# Patient Record
Sex: Female | Born: 1995 | Race: White | Hispanic: No | Marital: Single | Smoking: Never smoker
Health system: Southern US, Community
[De-identification: ages and names within clinical notes are randomized; demographics above are authoritative.]

## PROBLEM LIST (undated history)

## (undated) DIAGNOSIS — F32A Depression, unspecified: Secondary | ICD-10-CM

---
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
## 2020-02-06 ENCOUNTER — Encounter (HOSPITAL_COMMUNITY): Payer: Self-pay | Admitting: Psychiatry

## 2020-02-07 ENCOUNTER — Encounter (HOSPITAL_COMMUNITY): Payer: Self-pay | Admitting: Psychiatry

## 2020-02-07 ENCOUNTER — Telehealth (INDEPENDENT_AMBULATORY_CARE_PROVIDER_SITE_OTHER): Payer: BC Managed Care – PPO | Admitting: Psychiatry

## 2020-02-07 ENCOUNTER — Other Ambulatory Visit: Payer: Self-pay

## 2020-02-07 DIAGNOSIS — F3281 Premenstrual dysphoric disorder: Secondary | ICD-10-CM | POA: Diagnosis not present

## 2020-02-07 MED ORDER — HYDROXYZINE PAMOATE 25 MG PO CAPS: 25.0000 mg | ORAL_CAPSULE | Freq: Three times a day (TID) | ORAL | 1 refills | Status: AC | PRN

## 2020-02-07 MED ORDER — SERTRALINE HCL 100 MG PO TABS: 100.0000 mg | ORAL_TABLET | Freq: Every day | ORAL | 1 refills | Status: DC

## 2020-02-07 MED ORDER — LAMOTRIGINE 25 MG PO TABS: 25.0000 mg | ORAL_TABLET | Freq: Every evening | ORAL | 1 refills | Status: DC

## 2020-02-07 NOTE — Progress Notes (Signed)
Psychiatric Initial Adult Assessment   Patient Identification: Stacy Randall MRN:  [id number] Date of Evaluation:  02/07/2020 Referral Source: Self Chief Complaint:   Chief Complaint    Establish Care     Interview was conducted using videoconferencing application and I verified that I was speaking with the correct person using two identifiers. I discussed the limitations of evaluation and management by telemedicine and  the availability of in person appointments. Patient expressed understanding and agreed to proceed. Patient location - home; physician - home office.  Visit Diagnosis:    ICD-10-CM   1. Premenstrual dysphoric disorder  F32.81     History of Present Illness:  Stacy Randall is a 24 yo SWF who has been treated by her PCP since October 2020 for  episodic depression/anxiety. She has been feeling down, hopeless, at times passively suicidal for a day then next day will feel normal. She initially was experiencing significant anxiety but this is no longer the case.  She reports that her mood fluctuations occur for several days prior to her menses and resolve once period begins. Review of the chart indicates that she has been having these problems for a long time but they worsened in October to the point of her starting to have intermittent passive SI. She has no intent or plan and never attempted suicide. She reports good sleep and normal appetite. She denies having manic/hypomani episodes, psychotic symptoms. She has no hx of alcohol or drug abuse. There is no family hx of any psychiatric, alcohol or drug abuse issues. Medical hx was reviewed and is noncontributory (no chronic medical problems, no hx of surgeries).  In October she was started on escitalopram 10 mg which was subsequently increased to 20 mg. She tolerated it well (perhaps some weight gain) but her "moods" did not improve. 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 She only used hydroxyzine 2-3 x; it was effective but she no longer needs it, She also briefly took doxylamine for some insomnia (again no longer am problem). Few months ago lamotrigine was added (25-50 mg) for "moodiness". Unclear if it was helpful at 50 mg but she is now on only 25 mg at HS.   Associated Signs/Symptoms: Depression Symptoms:  depressed mood, difficulty concentrating, (Hypo) Manic Symptoms:  Denies Anxiety Symptoms:  denies Psychotic Symptoms:  denies PTSD Symptoms: NA  Past Psychiatric History: see above  Previous Psychotropic Medications: Yes   Substance Abuse History in the last 12 months:  No.  Consequences of Substance Abuse: NA  Past Medical History: History reviewed. No pertinent past medical history. History reviewed. No pertinent surgical history.  Family Psychiatric History: None  Family History: History reviewed. No pertinent family history.  Social History:   Social History   Socioeconomic History  . Marital status: Single    Spouse name: Not on file  . Number of children: 0  . Years of education: Not on file  . Highest education level: Not on file  Occupational History  . Occupation: CNA  Tobacco Use  . Smoking status: Never Smoker  . Smokeless tobacco: Never Used  Vaping Use  . Vaping Use: Never used  Substance and Sexual Activity  . Alcohol use: No  . Drug use: Never  . Sexual activity: Never    Birth control/protection: Abstinence  Other Topics Concern  . Not on file  Social History Narrative  . Not on file   Social Determinants of  Health   Financial Resource Strain:   . Difficulty of Paying Living Expenses:   Food Insecurity:   . Worried About Programme researcher, broadcasting/film/video in the Last Year:   . Barista in the Last Year:   Transportation Needs:   . Freight forwarder (Medical):   Marland Kitchen Lack of Transportation (Non-Medical):   Physical Activity:   . Days of Exercise per Week:   . Minutes of Exercise per  Session:   Stress:   . Feeling of Stress :   Social Connections:   . Frequency of Communication with Friends and Family:   . Frequency of Social Gatherings with Friends and Family:   . Attends Religious Services:   . Active Member of Clubs or Organizations:   . 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 She likes her job but it is physically demending (she is slim and finds it hard to lift patients when needed) and she is seeking some accommodations there.  Allergies:  No Known Allergies  Metabolic Disorder Labs: No results found for: HGBA1C, MPG No results found for: PROLACTIN No results found for: CHOL, TRIG, HDL, CHOLHDL, VLDL, LDLCALC No results found for: TSH  Therapeutic Level Labs: No results found for: LITHIUM No results found for: CBMZ No results found for: VALPROATE  Current Medications: Current Outpatient Medications  Medication Sig Dispense Refill  . lamoTRIgine (LAMICTAL) 25 MG tablet Take 1 tablet (25 mg total) by mouth at bedtime. 30 tablet 1  . fluconazole (DIFLUCAN) 150 MG tablet Take 1 tablet (150 mg total) by mouth once. May repeat in 3 days if symptoms do not resolve 2 tablet 0  . hydrOXYzine (VISTARIL) 25 MG capsule Take 1 capsule (25 mg total) by mouth 3 (three) times daily as needed. 30 capsule 1  . sertraline (ZOLOFT) 100 MG tablet Take 1 tablet (100 mg total) by mouth daily. 30 tablet 1   No current facility-administered medications for this visit.    Psychiatric Specialty Exam: Review of Systems  Psychiatric/Behavioral:       None besides those listed in HPI.     There were no vitals taken for this visit.There is no height or weight on file to calculate BMI.  General Appearance: Casual and Well Groomed  Eye Contact:  Fair  Speech:  Clear and Coherent and Normal Rate  Volume:  Decreased  Mood:  Depressed  Affect:  Restricted  Thought Process:  Goal  Directed  Orientation:  Full (Time, Place, and Person)  Thought Content:  Logical  Suicidal Thoughts:  No  Homicidal Thoughts:  No  Memory:  Immediate;   Good Recent;   Good Remote;   Good  Judgement:  Good  Insight:  Fair  Psychomotor Activity:  Normal  Concentration:  Concentration: Fair  Recall:  Good  Fund of Knowledge:Good  Language: Good  Akathisia:  Negative  Handed:  Right  AIMS (if indicated):  not done  Assets:  Desire for Improvement Financial Resources/Insurance Housing Physical Health Social Support Talents/Skills  ADL's:  Intact  Cognition: WNL  Sleep:  Good   Assessment and Plan: 24 yo SWF who has been treated by her PCP since October 2020 for  episodic depression/anxiety. She has been feeling down, hopeless, at times passively suicidal for a day then next day will feel normal. She initially was experiencing significant anxiety but this is no longer  the case.  She reports that her mood fluctuations occur for several days prior to her menses and resolve once period begins. Review of the chart indicates that she has been having these problems for a long time but they worsened in October to the point of her starting to have intermittent passive SI. She has no intent or plan and never attempted suicide. She reports good sleep and normal appetite. She denies having manic/hypomani episodes, psychotic symptoms. She has no hx of alcohol or drug abuse. There is no family hx of any psychiatric, alcohol or drug abuse issues. Medical hx was reviewed and is noncontributory (no chronic medical problems, no hx of surgeries).  In October she was started on escitalopram 10 mg which was subsequently increased to 20 mg. She tolerated it well (perhaps some weight gain) but her "moods" did not improve. 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 She only used hydroxyzine 2-3 x; it was effective but she no longer  needs it, She also briefly took doxylamine for some insomnia (again no longer am problem). Few months ago lamotrigine was added (25-50 mg) for "moodiness". Unclear if it was helpful at 50 mg but she is now on only 25 mg at HS.   I believe Stacy Randall is struggling with premenstrual dysphoric disorder and an SSRI is a right medication to use but she likely needs dose to be increased. I see no sufficient evidence to suspect major depressive disorder or bipolar disorder.  Dx: PMD  Plan: Increase sertraline to 100 mg at HS; continue lamictal 25 mg at HS for now. Mother Stacy Randall) thought that when her daughter was on 50 mg her mood possible improved. We may want to increase the dose if sertraline is only partially effective (otherwise I will dc it next). Next appointment in 6 weeks. The plan was discussed with patient and mother who had an opportunity to ask questions and these were all answered. I spend 45 minutes in videoconferencing with the patient and devoted approximately 50% of this time to explanation of diagnosis, discussion of treatment options and med education.   Magdalene Patricia, MD 6/30/20218:34 AM

## 2020-03-18 ENCOUNTER — Telehealth (HOSPITAL_COMMUNITY): Payer: BC Managed Care – PPO | Admitting: Psychiatry

## 2020-03-18 ENCOUNTER — Other Ambulatory Visit: Payer: Self-pay

## 2020-03-24 ENCOUNTER — Other Ambulatory Visit (HOSPITAL_COMMUNITY): Payer: Self-pay | Admitting: Psychiatry

## 2020-06-11 ENCOUNTER — Telehealth (HOSPITAL_COMMUNITY): Payer: Self-pay | Admitting: Professional

## 2020-06-12 ENCOUNTER — Other Ambulatory Visit (HOSPITAL_COMMUNITY): Payer: BC Managed Care – PPO | Attending: Psychiatry | Admitting: Licensed Clinical Social Worker

## 2020-06-12 ENCOUNTER — Other Ambulatory Visit: Payer: Self-pay

## 2020-06-12 DIAGNOSIS — F332 Major depressive disorder, recurrent severe without psychotic features: Secondary | ICD-10-CM

## 2020-06-12 DIAGNOSIS — F411 Generalized anxiety disorder: Secondary | ICD-10-CM

## 2020-06-12 NOTE — Psych (Signed)
Virtual Visit via Video Note  I connected with Stacy Randall on 06/12/20 at 11:30 AM EDT by a video enabled telemedicine application and verified that I am speaking with the correct person using two identifiers.  Location: Patient: Patient Home Provider: Clinical home office   I discussed the limitations of evaluation and management by telemedicine and the availability of in person appointments. The patient expressed understanding and agreed to proceed.  I discussed the assessment and treatment plan with the patient. The patient was provided an opportunity to ask questions and all were answered. The patient agreed with the plan and demonstrated an understanding of the instructions.   The patient was advised to call back or seek an in-person evaluation if the symptoms worsen or if the condition fails to improve as anticipated.  I provided 60 minutes of non-face-to-face time during this encounter.   Donia Guiles, LCSW    Comprehensive Clinical Assessment (CCA) Note  06/12/2020 Stacy Randall [id number]   CCA Biopsychosocial  Intake/Chief Complaint:  Pt presents as referral to PHP via family member. Pt reports episodic history of depression and anxiety since at least 24 years of age. Pt denies previous therapeutic treatment and that she has tried a handful of medications since the beginning of this year. Pt states she does not like the idea of medication and becomes noncompliant. Pt has not taken medication in the past 2 months. In June, pt was diagnosed with PMDD and pt identifies struggling with mood around her menstrual cycle, however reports today that her symptoms are independent to her cycle and not confined to any patterns related to menstruation. Pt reports her current epiosde of depression has been increasing since July and that currently she is concerned about the interference it is causing at work. Pt states having to call out and leave early due to symptoms. Pt states she  has been isolating and shutting down and her support system has expressed concern. Pt denies substance use history and AVH. Pt reports passive SI and hopelessness.   Patient Reported Schizophrenia/Schizoaffective Diagnosis in Past: No   Mental Health Symptoms Depression:  Change in energy/activity;Difficulty Concentrating;Hopelessness;Increase/decrease in appetite;Irritability;Tearfulness   Duration of Depressive symptoms: Greater than two weeks   Mania:  None   Anxiety:   Irritability;Restlessness;Tension;Worrying;Difficulty concentrating   Psychosis:  None   Duration of Psychotic symptoms: No data recorded  Trauma:  None   Obsessions:  None   Compulsions:  None   Inattention:  None   Hyperactivity/Impulsivity:  N/A   Oppositional/Defiant Behaviors:  None   Emotional Irregularity:  None   Other Mood/Personality Symptoms:  No data recorded   Mental Status Exam Appearance and self-care  Stature:  Tall   Weight:  Thin   Clothing:  Casual   Grooming:  Normal   Cosmetic use:  None   Posture/gait:  Normal   Motor activity:  Not Remarkable   Sensorium  Attention:  Normal   Concentration:  Anxiety interferes   Orientation:  X5   Recall/memory:  Normal   Affect and Mood  Affect:  Depressed;Flat   Mood:  Anxious;Depressed   Relating  Eye contact:  Fleeting   Facial expression:  Depressed   Attitude toward examiner:  Cooperative   Thought and Language  Speech flow: Normal   Thought content:  Appropriate to Mood and Circumstances   Preoccupation:  None   Hallucinations:  None   Organization:  No data recorded  [employer] of Knowledge:  Fair   Intelligence:  Average   Abstraction:  Normal   Judgement:  Fair   Reality Testing:  Realistic   Insight:  Poor   Decision Making:  Vacilates   Social Functioning  Social Maturity:  No data recorded  Social Judgement:  No data recorded  Stress  Stressors:  Work;Transitions    Coping Ability:  Human resources officer Deficits:  Self-care   Supports:  Family      Religion: Religion/Spirituality Are You A Religious Person?: No  Leisure/Recreation: Leisure / Recreation Do You Have Hobbies?: Yes Leisure and Hobbies: playing video games, being outdoors, going on drives  Exercise/Diet: Exercise/Diet Do You Exercise?: Yes What Type of Exercise Do You Do?: Hiking, Other (Comment) How Many Times a Week Do You Exercise?: 4-5 times a week Have You Gained or Lost A Significant Amount of Weight in the Past Six Months?: No Do You Follow a Special Diet?: No Do You Have Any Trouble Sleeping?: Yes Explanation of Sleeping Difficulties: Pt reports anxiety interferes with falling asleep   CCA Employment/Education  Employment/Work Situation: Employment / Work Situation Employment situation: Employed 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 patient been employed?: about 2 mo Patient's job has been impacted by current illness: Yes Describe how patient's job has been impacted: Pt has had to leave early and call out Has patient ever been in the [employer]?: No  Education: Education Is Patient Currently Attending School?: No Did Garment/textile technologist From [employer]?: Yes Did You Have An Individualized Education Program (IIEP): No Did You Have Any Difficulty At [employer]?: No Patient's Education Has Been Impacted by Current Illness: No   CCA Family/Childhood History  Family and Relationship History: Family history Marital status: Single Does patient have children?: No  Childhood History:  Childhood History By whom was/is the patient raised?: Both parents Patient's description of current relationship with people who raised him/her: Pt reports parents are supportive and they have a positive relationship Does patient have siblings?: Yes Number of Siblings: 3 Description of patient's current relationship with siblings: Pt  reports positive relationship with her siblings Did patient suffer any verbal/emotional/physical/sexual abuse as a child?: No Did patient suffer from severe childhood neglect?: No Has patient ever been sexually abused/assaulted/raped as an adolescent or adult?: No Was the patient ever a victim of a crime or a disaster?: No Witnessed domestic violence?: No Has patient been affected by domestic violence as an adult?: No  Child/Adolescent Assessment:     CCA Substance Use  Alcohol/Drug Use: Alcohol / Drug Use Pain Medications: Pt denies Prescriptions: Pt denies Over the Counter: Pt denies History of alcohol / drug use?: No history of alcohol / drug abuse                         ASAM's:  Six Dimensions of Multidimensional Assessment  Dimension 1:  Acute Intoxication and/or Withdrawal Potential:      Dimension 2:  Biomedical Conditions and Complications:      Dimension 3:  Emotional, Behavioral, or Cognitive Conditions and Complications:     Dimension 4:  Readiness to Change:     Dimension 5:  Relapse, Continued use, or Continued Problem Potential:     Dimension 6:  Recovery/Living Environment:     ASAM Severity Score:    ASAM Recommended Level of Treatment:     Substance use Disorder (SUD)    Recommendations for Services/Supports/Treatments: Recommendations for Services/Supports/Treatments Recommendations For Services/Supports/Treatments: Partial Hospitalization (Pt is  recommended PHP due to increasing symptoms that are causing decline in daily functioning)  DSM5 Diagnoses: Patient Active Problem List   Diagnosis Date Noted  . Premenstrual dysphoric disorder 02/07/2020    Patient Centered Plan: Patient is on the following Treatment Plan(s):  Anxiety and Depression   Referrals to Alternative Service(s): Referred to Alternative Service(s):   Place:   Date:   Time:    Referred to Alternative Service(s):   Place:   Date:   Time:    Referred to Alternative  Service(s):   Place:   Date:   Time:    Referred to Alternative Service(s):   Place:   Date:   Time:     Donia Guiles, LCSW

## 2020-06-13 ENCOUNTER — Other Ambulatory Visit: Payer: Self-pay

## 2020-06-13 ENCOUNTER — Encounter (HOSPITAL_COMMUNITY): Payer: Self-pay

## 2020-06-13 ENCOUNTER — Other Ambulatory Visit (HOSPITAL_COMMUNITY): Payer: BC Managed Care – PPO | Admitting: Occupational Therapy

## 2020-06-13 ENCOUNTER — Other Ambulatory Visit (HOSPITAL_COMMUNITY): Payer: BC Managed Care – PPO | Admitting: Licensed Clinical Social Worker

## 2020-06-13 DIAGNOSIS — F332 Major depressive disorder, recurrent severe without psychotic features: Secondary | ICD-10-CM

## 2020-06-13 DIAGNOSIS — R41844 Frontal lobe and executive function deficit: Secondary | ICD-10-CM

## 2020-06-13 DIAGNOSIS — R4589 Other symptoms and signs involving emotional state: Secondary | ICD-10-CM

## 2020-06-13 DIAGNOSIS — F411 Generalized anxiety disorder: Secondary | ICD-10-CM

## 2020-06-13 NOTE — Therapy (Signed)
[facility] PARTIAL HOSPITALIZATION PROGRAM [address] [place], [place], [address] Phone: [phone]   Fax:  [phone] Virtual Visit via Video Note  I connected with Stacy Randall on 06/13/20 at  11:00 AM EDT by a video enabled telemedicine application and verified that I am speaking with the correct person using two identifiers.  Location: Patient: Patient Home Provider: Clinic Office    I discussed the limitations of evaluation and management by telemedicine and the availability of in person appointments. The patient expressed understanding and agreed to proceed.     I discussed the assessment and treatment plan with the patient. The patient was provided an opportunity to ask questions and all were answered. The patient agreed with the plan and demonstrated an understanding of the instructions.   The patient was advised to call back or seek an in-person evaluation if the symptoms worsen or if the condition fails to improve as anticipated.  I provided 75 minutes of non-face-to-face time during this encounter. 15 minutes OT Evaluation 60 minutes OT Group Therapy   Donne Hazel, OT   Occupational Therapy Evaluation  Patient Details  Name: Stacy Randall MRN: [id number] Date of Birth: Jul 12, 1996 Referring Provider (OT): Hillery Jacks   Encounter Date: 06/13/2020   OT End of Session - 06/13/20 1330    Visit Number 1    Number of Visits 20    Date for OT Re-Evaluation 07/11/20    Authorization Type [employer]    OT Start Time 1100   1030-1045   OT Stop Time 1200    OT Time Calculation (min) 60 min    Activity Tolerance Patient tolerated treatment well    Behavior During Therapy Stacy [facility] for tasks assessed/performed           History reviewed. No pertinent past medical history.  History reviewed. No pertinent surgical history.  There were no vitals filed for this visit.   Subjective Assessment - 06/13/20 1328    Currently in Pain?  No/denies             [facility] OT Assessment - 06/13/20 0001      Assessment   Medical Diagnosis Major depression    Referring Provider (OT) Hillery Jacks      Precautions   Precautions None      Balance Screen   Has the patient fallen in the past 6 months No    Has the patient had a decrease in activity level because of a fear of falling?  No    Is the patient reluctant to leave their home because of a fear of falling?  No             OT Education - 06/13/20 1328    Education Details Educated on OT within [facility] programming along with different communication styles and identified strategies/tips to practice being more assertive    Person(s) Educated Patient    Methods Explanation;Handout    Comprehension Verbalized understanding            OT Short Term Goals - 06/13/20 1335      OT SHORT TERM GOAL #1   Title Pt will actively engage in OT group sessions throughout duration of PHP programming, in order to promote daily structure, social engagement, and opportunities to develop and utilize adaptive strategies to maximize functional performance in preparation for safe transition and integration back into school, work, and the community.    Time 4    Period Weeks    Status  New    Target Date 07/11/20      OT SHORT TERM GOAL #2   Title Pt will practice and identify 1-3 adaptive coping strategies she can utilize, in order to safely manage increased depression/anxiety, with min cues, in preparation for safe and healthy reintegration back into the community at discharge.    Time 4    Period Weeks    Status New    Target Date 07/11/20      OT SHORT TERM GOAL #3   Title Pt will identify 1-3 stress management strategies she can utilize, in order to safely manage increased psychosocial stressors identified, with min cues, in preparation for safe transition back to the community at discharge.    Time 4    Period Weeks    Status New    Target Date 07/11/20         Occupational  Therapy Assessment 06/13/2020  [name] is a 24 y/o female with PMHx of MDD and PMDD who was referred to [facility] by her outpatient provider for worsening depression and anxiety outside of her menstrual cycle that began affecting her performance at work. Upon approach, pt presents as calm, cooperative, and relatively forth coming with information. Pt reports enjoying video games, movies, and driving around listening to music and identifies goal for admission "manage these thoughts and feelings so it stops affecting my work".   Precautions/Limitations: None noted  Cognition: Appears intact   Visual Motor: Denies use of any visual aids including use of glasses/contacts.   Living Situation: Pt lives with Mom and her two dogs Victory Dakin and Roxie.  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   ADL/iADL Performance: Pt denies any difficulties with ADL/iADL performance outside of work.    Leisure Interests and Hobbies: Enjoys video games, movies, and driving around listening to music  Social Support: Identifies Mom and older sister as sources of support    What do you do when you are very stressed, angry, upset, sad or anxious? Isolate from others, Cry, Talk to someone, Sleep and Listen to music   What helps when you are not feeling well? Wrapping in a blanket, Taking a shower or bath, Going for a walk, Having a warm or cool drink, Pacing the hall, Eating something and Listening to music  What are some things that make it MORE difficult for you when you are already upset? Being criticized and Yelling  Is there anything specific that you would like help with while you're in the hospital? Stress Management  Assessment: Pt demonstrates behavior that inhibits/restricts participation in occupation and would benefit from skilled occupational therapy services to address current difficulties with symptom management, emotion regulation, socialization, stress management, time  management, job readiness, financial wellness, health and nutrition, sleep hygiene, ADL/iADL performance and leisure participation, in preparation for reintegration and return to community at discharge.   Plan: Pt will participate in skilled occupational therapy sessions (group and/or individual) in order to promote daily structure, social engagement, and opportunities to develop and utilize adaptive strategies to maximize functional performance in preparation for safe transition and integration back into school, work, and/or the community at discharge. OT sessions will occur 4-5 x per week for 2-4 weeks.   Donne Hazel, MOT, OTR/L  Group Session:  S: "I think I communicate differently dependent on the person I am speaking with."  O:Today's group focused on topic of Communication Styles. Group members were educated on the different styles including passive, aggressive, and assertive  communication. Members shared and reflected on which style they most often find themselves communicating in and how to transition to a more assertive approach.   A: Chanay was active in her participation of discussion and activity, sharing that her communication style changes dependent on who she is speaking with. She shared that she often takes a passive approach when speaking to her family members and at work. She appeared receptive to information received and related often to other group members.   P: Continue to attend PHP OT group sessions 5x week for 4 weeks to promote daily structure, social engagement, and opportunities to develop and utilize adaptive strategies to maximize functional performance in preparation for safe transition and integration back into school, work, and the community. Plan to address topic of assertive communication in next OT group session.     Plan - 06/13/20 1330    Clinical Impression Statement Stacy Randall is a 24 y/o female with PMHx of premenstrual dysphoric disorder with limited  reports of increased psychosocial stressors, however reports increased anxiety, depression, and negative thoughts, negatively impacting her ability to function at work. Pt reports a desire to to engage in [facility] programming in order to manage identified stressors and to engage meaningfully in identified areas of occupation and ADL/iADLs.    OT Occupational Profile and History Problem Focused Assessment - Including review of records relating to presenting problem    Occupational performance deficits (Please refer to evaluation for details): ADL's;IADL's;Rest and Sleep;Work;Education;Leisure;Social Participation    Body Structure / Function / Physical Skills ADL    Cognitive Skills Attention;Consciousness;Emotional;Energy/Drive;Memory;Perception;Problem Solve;Safety Awareness;Temperament/Personality;Thought;Understand    Psychosocial Skills Coping Strategies;Environmental  Adaptations;Habits;Interpersonal Interaction;Routines and Behaviors    Rehab Potential Good    Clinical Decision Making Limited treatment options, no task modification necessary    Comorbidities Affecting Occupational Performance: May have comorbidities impacting occupational performance    Modification or Assistance to Complete Evaluation  No modification of tasks or assist necessary to complete eval    OT Frequency 5x / week    OT Duration 4 weeks    OT Treatment/Interventions Self-care/ADL training;Patient/family education;Coping strategies training;Psychosocial skills training           Patient will benefit from skilled therapeutic intervention in order to improve the following deficits and impairments:   Body Structure / Function / Physical Skills: ADL Cognitive Skills: Attention, Consciousness, Emotional, Energy/Drive, Memory, Perception, Problem Solve, Safety Awareness, Temperament/Personality, Thought, Understand Psychosocial Skills: Coping Strategies, Environmental  Adaptations, Habits, Interpersonal Interaction, Routines  and Behaviors   Visit Diagnosis: Difficulty coping  Frontal lobe and executive function deficit  Severe episode of recurrent major depressive disorder, without psychotic features (HCC)    Problem List Patient Active Problem List   Diagnosis Date Noted  . Premenstrual dysphoric disorder 02/07/2020    Donne Hazel 06/13/2020, 1:37 PM  [facility] PARTIAL HOSPITALIZATION PROGRAM [address] [place], [place], [address] Phone: [phone]   Fax:  [phone]  Name: Stacy Randall MRN: [id number] Date of Birth: 10-04-1995

## 2020-06-14 ENCOUNTER — Encounter (HOSPITAL_COMMUNITY): Payer: Self-pay

## 2020-06-14 ENCOUNTER — Other Ambulatory Visit (HOSPITAL_COMMUNITY): Payer: BC Managed Care – PPO | Admitting: Licensed Clinical Social Worker

## 2020-06-14 ENCOUNTER — Other Ambulatory Visit (HOSPITAL_COMMUNITY): Payer: BC Managed Care – PPO | Admitting: Occupational Therapy

## 2020-06-14 ENCOUNTER — Other Ambulatory Visit: Payer: Self-pay

## 2020-06-14 DIAGNOSIS — R4589 Other symptoms and signs involving emotional state: Secondary | ICD-10-CM

## 2020-06-14 DIAGNOSIS — F332 Major depressive disorder, recurrent severe without psychotic features: Secondary | ICD-10-CM

## 2020-06-14 DIAGNOSIS — R41844 Frontal lobe and executive function deficit: Secondary | ICD-10-CM

## 2020-06-14 NOTE — Therapy (Signed)
[facility] PARTIAL HOSPITALIZATION PROGRAM [address] [place], [place], [address] Phone: [phone]   Fax:  [phone] Virtual Visit via Video Note  I connected with Stacy Randall on 06/14/20 at  11:00 AM EDT by a video enabled telemedicine application and verified that I am speaking with the correct person using two identifiers.  Location: Patient: Patient Home Provider: Clinic Office    I discussed the limitations of evaluation and management by telemedicine and the availability of in person appointments. The patient expressed understanding and agreed to proceed.  I discussed the assessment and treatment plan with the patient. The patient was provided an opportunity to ask questions and all were answered. The patient agreed with the plan and demonstrated an understanding of the instructions.   The patient was advised to call back or seek an in-person evaluation if the symptoms worsen or if the condition fails to improve as anticipated.  I provided 65 minutes of non-face-to-face time during this encounter.   Donne Hazel, OT   Occupational Therapy Treatment  Patient Details  Name: Stacy Randall MRN: [id number] Date of Birth: 03-18-1996 Referring Provider (OT): Hillery Jacks   Encounter Date: 06/14/2020   OT End of Session - 06/14/20 1223    Visit Number 2    Number of Visits 20    Date for OT Re-Evaluation 07/11/20    Authorization Type [employer]    OT Start Time 1055    OT Stop Time 1200    OT Time Calculation (min) 65 min    Activity Tolerance Patient tolerated treatment well    Behavior During Therapy [facility] for tasks assessed/performed           History reviewed. No pertinent past medical history.  History reviewed. No pertinent surgical history.  There were no vitals filed for this visit.   Subjective Assessment - 06/14/20 1223    Currently in Pain? No/denies             OT Education - 06/14/20 1223    Education  Details Educated on different communication styles with strategies to become more assertive with use of XYZ communication tool    Person(s) Educated Patient    Methods Explanation;Handout    Comprehension Verbalized understanding            OT Short Term Goals - 06/14/20 1223      OT SHORT TERM GOAL #1   Title Pt will actively engage in OT group sessions throughout duration of PHP programming, in order to promote daily structure, social engagement, and opportunities to develop and utilize adaptive strategies to maximize functional performance in preparation for safe transition and integration back into school, work, and the community.    Status On-going      OT SHORT TERM GOAL #2   Title Pt will practice and identify 1-3 adaptive coping strategies she can utilize, in order to safely manage increased depression/anxiety, with min cues, in preparation for safe and healthy reintegration back into the community at discharge.    Status On-going      OT SHORT TERM GOAL #3   Title Pt will identify 1-3 stress management strategies she can utilize, in order to safely manage increased psychosocial stressors identified, with min cues, in preparation for safe transition back to the community at discharge.    Status On-going         Group Session:  S: "I had a neighbor who I often lent money too and eventually I had to  tell him no because I could not afford it."  O: Group began with a reflection from previous OT session focused on communication styles and group members re-iterated what was learned during previous session. Members shared and reflected on any opportunities they were presented with last evening to practice their assertiveness skills or recognize patterns of communication observed. Today's group focused on assertiveness skills training and use of the XYZ* assertive communication tool was introduced. The XYZ communication tool states: I feel X when you do Y in situation Z and I would like  _________. X is the emotion, Y is the specific behavior, and Z is the specific situation. Group members each formulated their own XYZ statement and shared with the group to discuss and offer feedback. Additional tips and strategies to practice being assertive were also introduced and discussed.  A: Willowdean was active in her participation of discussion and shared a passive communication situation in which she would lend her neighbor money for food and would drive him to work and other appointments. She shared that at first it was a kind gesture, but it became too much and she was taken advantage of. She reported that she was very passive in the situation, however looking back, would liked to have been assertive quicker and state that she could not do this anymore and tell him no. She also brainstormed alternative strategies she may have offered. Pt appeared receptive to feedback from peers, while also sharing similar experiences with others. Pt receptive to education received.   P: Continue to attend PHP OT group sessions 5x week for 3 weeks to promote daily structure, social engagement, and opportunities to develop and utilize adaptive strategies to maximize functional performance in preparation for safe transition and integration back into school, work, and the community.    Plan - 06/14/20 1223    Occupational performance deficits (Please refer to evaluation for details): ADL's;IADL's;Rest and Sleep;Work;Education;Leisure;Social Participation    Body Structure / Function / Physical Skills ADL    Cognitive Skills Attention;Consciousness;Emotional;Energy/Drive;Memory;Perception;Problem Solve;Safety Awareness;Temperament/Personality;Thought;Understand    Psychosocial Skills Coping Strategies;Environmental  Adaptations;Habits;Interpersonal Interaction;Routines and Behaviors           Patient will benefit from skilled therapeutic intervention in order to improve the following deficits and impairments:    Body Structure / Function / Physical Skills: ADL Cognitive Skills: Attention, Consciousness, Emotional, Energy/Drive, Memory, Perception, Problem Solve, Safety Awareness, Temperament/Personality, Thought, Understand Psychosocial Skills: Coping Strategies, Environmental  Adaptations, Habits, Interpersonal Interaction, Routines and Behaviors   Visit Diagnosis: Difficulty coping  Frontal lobe and executive function deficit  Severe episode of recurrent major depressive disorder, without psychotic features (HCC)    Problem List Patient Active Problem List   Diagnosis Date Noted  . Premenstrual dysphoric disorder 02/07/2020    06/14/2020  Donne Hazel, MOT, OTR/L  06/14/2020, 12:24 PM  [facility] PARTIAL HOSPITALIZATION PROGRAM [address] [place], [place], [address] Phone: [phone]   Fax:  [phone]  Name: Veronia Laprise MRN: [id number] Date of Birth: 12-20-1995

## 2020-06-15 ENCOUNTER — Encounter (HOSPITAL_COMMUNITY): Payer: Self-pay | Admitting: Family

## 2020-06-15 NOTE — Progress Notes (Signed)
Behavioral Health Partial Program Assessment Note  Date: 06/15/2020 Name: Stacy Randall MRN: [id number]     HPI: Stacy Randall is a 24 y.o. Caucasian female presents with depressed mood. She reported" I have struggle with depression and anxiety since about age 67."  States she was followed by primary care when her mood swings and depression got worse. Denied that her mood is seasonal or cyclic.  She stated that her emotions is triggered by her monthly menstrual cycles.  Reports elevated moods and low moods that come and go.  No identifiable triggers.  Denies suicidal attempts. denied family history with mental illness. She denied previous inpatient admissions.  Denied a history of physical or sexual abuse.  Denied illicit drug use and/or alcohol abuse.  Stacy Randall reported she is currently prescribed Lamictal and Zoloft, however states she has not taken medication in a while.   states she did not feel medication was helpful.  States she is seeking therapy to see if that will abate her symptoms.  Patient was enrolled in partial psychiatric program on 06/15/20.  Primary complaints include: feeling depressed and feeling suicidal.  Onset of symptoms was gradual with gradually worsening course since that time. Psychosocial Stressors include the following: family and occupational.   I have reviewed the following documentation dated 06/14/2020: past psychiatric history, past medical history and past social and family history  Complaints of Pain: nonear Past Psychiatric History:  Past psychiatric hospitalizations  and Previous suicide attempts   Currently in treatment with .  Substance Abuse History: none Use of Alcohol: denied Use of Caffeine: denies use Use of over the counter:   No past surgical history on file.  No past medical history on file. Outpatient Encounter Medications as of 06/14/2020  Medication Sig  . fluconazole (DIFLUCAN) 150 MG tablet Take 1 tablet (150 mg total) by mouth  once. May repeat in 3 days if symptoms do not resolve  . lamoTRIgine (LAMICTAL) 25 MG tablet TAKE 1 TABLET BY MOUTH EVERYDAY AT BEDTIME   No facility-administered encounter medications on file as of 06/14/2020.   No Known Allergies  Social History   Tobacco Use  . Smoking status: Never Smoker  . Smokeless tobacco: Never Used  Substance Use Topics  . Alcohol use: No   Functioning Relationships: good support system Education: College  Other Pertinent History: None No family history on file.   Review of Systems Constitutional: negative  Objective:  There were no vitals filed for this visit.  Physical Exam:   Mental Status Exam: Appearance:  Well groomed Psychomotor::  Within Normal Limits Attention span and concentration: Normal Behavior: calm and cooperative Speech:  normal pitch and normal volume Mood:  depressed and anxious Affect:  normal and flat Thought Process:  Coherent Thought Content:  Logical Orientation:  person, place and time/date Cognition:  grossly intact Insight:  Intact Judgment:  Intact Estimate of Intelligence: Average Fund of knowledge: Aware of current events Memory: Recent and remote intact and Recent intact Abnormal movements: None Gait and station: Not tested  Assessment:  Diagnosis: Severe episode of recurrent major depressive disorder, without psychotic features (HCC) [F33.2] 1. Severe episode of recurrent major depressive disorder, without psychotic features (HCC)     Indications for admission: inpatient care required if not in partial hospital program  Plan: Orders placed for occupational therapy ( OT)  patient enrolled in Partial Hospitalization Program, patient's current medications are to be continued, a comprehensive treatment plan will be developed and side effects of medications have been reviewed  with patient  Decline medication management at this time -Consider TSH level- orders placed  Treatment options and  alternatives reviewed with patient and patient understands the above plan. Treatment plan was reviewed and agreed upon by NP T.Melvyn Neth and patient Stacy Randall need for group services    [name], NP

## 2020-06-17 ENCOUNTER — Encounter (HOSPITAL_COMMUNITY): Payer: Self-pay

## 2020-06-17 ENCOUNTER — Other Ambulatory Visit (HOSPITAL_COMMUNITY): Payer: BC Managed Care – PPO | Admitting: Occupational Therapy

## 2020-06-17 ENCOUNTER — Other Ambulatory Visit (HOSPITAL_COMMUNITY): Payer: BC Managed Care – PPO | Admitting: Licensed Clinical Social Worker

## 2020-06-17 ENCOUNTER — Other Ambulatory Visit: Payer: Self-pay

## 2020-06-17 DIAGNOSIS — F411 Generalized anxiety disorder: Secondary | ICD-10-CM

## 2020-06-17 DIAGNOSIS — R41844 Frontal lobe and executive function deficit: Secondary | ICD-10-CM

## 2020-06-17 DIAGNOSIS — R4589 Other symptoms and signs involving emotional state: Secondary | ICD-10-CM

## 2020-06-17 DIAGNOSIS — F332 Major depressive disorder, recurrent severe without psychotic features: Secondary | ICD-10-CM | POA: Diagnosis not present

## 2020-06-17 NOTE — Therapy (Signed)
[facility] PARTIAL HOSPITALIZATION PROGRAM [address] [place], [place], [address] Phone: [phone]   Fax:  [phone] Virtual Visit via Video Note  I connected with Stacy Randall on 06/17/20 at  11:00 AM EST by a video enabled telemedicine application and verified that I am speaking with the correct person using two identifiers.  Location: Patient: Patient Home Provider: Clinic Office    I discussed the limitations of evaluation and management by telemedicine and the availability of in person appointments. The patient expressed understanding and agreed to proceed.   I discussed the assessment and treatment plan with the patient. The patient was provided an opportunity to ask questions and all were answered. The patient agreed with the plan and demonstrated an understanding of the instructions.   The patient was advised to call back or seek an in-person evaluation if the symptoms worsen or if the condition fails to improve as anticipated.  I provided 60 minutes of non-face-to-face time during this encounter.   Stacy Randall, OT   Occupational Therapy Treatment  Patient Details  Name: Stacy Randall MRN: [id number] Date of Birth: Nov 20, 1995 Referring Provider (OT): Hillery Jacks   Encounter Date: 06/17/2020   OT End of Session - 06/17/20 1320    Visit Number 3    Number of Visits 20    Date for OT Re-Evaluation 07/11/20    Authorization Type [employer]    OT Start Time 1105    OT Stop Time 1205    OT Time Calculation (min) 60 min    Activity Tolerance Patient tolerated treatment well    Behavior During Therapy Stacy Randall [facility] [place] for tasks assessed/performed           History reviewed. No pertinent past medical history.  History reviewed. No pertinent surgical history.  There were no vitals filed for this visit.   Subjective Assessment - 06/17/20 1320    Currently in Pain? No/denies            OT Education - 06/17/20 1320    Education  Details Educated on physical symptomology of stress and its effects on the body, along with positive stress management tips/strategies    Person(s) Educated Patient    Methods Explanation;Handout    Comprehension Verbalized understanding            OT Short Term Goals - 06/14/20 1223      OT SHORT TERM GOAL #1   Title Pt will actively engage in OT group sessions throughout duration of PHP programming, in order to promote daily structure, social engagement, and opportunities to develop and utilize adaptive strategies to maximize functional performance in preparation for safe transition and integration back into school, work, and the community.    Status On-going      OT SHORT TERM GOAL #2   Title Pt will practice and identify 1-3 adaptive coping strategies she can utilize, in order to safely manage increased depression/anxiety, with min cues, in preparation for safe and healthy reintegration back into the community at discharge.    Status On-going      OT SHORT TERM GOAL #3   Title Pt will identify 1-3 stress management strategies she can utilize, in order to safely manage increased psychosocial stressors identified, with min cues, in preparation for safe transition back to the community at discharge.    Status On-going           Group Session:  S: "Nothing specific that I can think of right now, maybe managing my  mental health."  O: Group began with a check-in and review of previous OT session focused on assertive communication skills.Today's discussion focused on the topic of stress management. Group members worked collaboratively to create a Microbiologist identifying physical signs, behavioral signs, emotional/psychological, and cognitive signs of stress. Discussion then focused and encouraged group members to identify positive stress management strategies they could utilize in those moments to manage identified signs.   A: [name] was active in her participation of  discussion/activity and shared that she is not currently stressed about anything in particular, however with support, identified difficulty managing the onset of her anxiety/depressive symptoms affecting her work. She shared that she had a good weekend with friends, which may have a been a factor in her stress level going down, however shared that in the past she has managed by sleeping it off and going for a drive in the car and listening to music. She appeared receptive to further strategies and education provided, with an interest in looking further into progressive muscle relaxation and guided meditation.   P: Continue to attend PHP OT group sessions 5x week for 2 weeks to promote daily structure, social engagement, and opportunities to develop and utilize adaptive strategies to maximize functional performance in preparation for safe transition and integration back into school, work, and the community. Plan to address topic of health and wellness in next OT group session.  Plan - 06/17/20 1321    Occupational performance deficits (Please refer to evaluation for details): ADL's;IADL's;Rest and Sleep;Work;Education;Leisure;Social Participation    Body Structure / Function / Physical Skills ADL    Cognitive Skills Attention;Consciousness;Emotional;Energy/Drive;Memory;Perception;Problem Solve;Safety Awareness;Temperament/Personality;Thought;Understand    Psychosocial Skills Coping Strategies;Environmental  Adaptations;Habits;Interpersonal Interaction;Routines and Behaviors           Patient will benefit from skilled therapeutic intervention in order to improve the following deficits and impairments:   Body Structure / Function / Physical Skills: ADL Cognitive Skills: Attention, Consciousness, Emotional, Energy/Drive, Memory, Perception, Problem Solve, Safety Awareness, Temperament/Personality, Thought, Understand Psychosocial Skills: Coping Strategies, Environmental  Adaptations, Habits,  Interpersonal Interaction, Routines and Behaviors   Visit Diagnosis: Difficulty coping  Frontal lobe and executive function deficit  Severe episode of recurrent major depressive disorder, without psychotic features (HCC)    Problem List Patient Active Problem List   Diagnosis Date Noted  . Premenstrual dysphoric disorder 02/07/2020    06/17/2020  Stacy Randall, MOT, OTR/L  06/17/2020, 1:21 PM  [facility] HOSPITALIZATION PROGRAM [address] [place], [place], [address] Phone: [phone]   Fax:  [phone]  Name: Stacy Randall MRN: [id number] Date of Birth: Jun 03, 1996

## 2020-06-17 NOTE — Progress Notes (Signed)
Spoke with patient via webex video call, used 2 identifiers to correctly identify patient. States that groups are going well and they are not what she had expected but in a good way. She was referred by Dr. Lucianne Muss. This is her first time in PHP. Dealing with depression and anxiety with having moments of "not wanting to be here." Denies SI or HI and never had a plan for suicide but thinking she did not care if she died. She is sleeping well. Denies AV hallucinations. On scale 1-10 as 10 being worst she rates depression at 2 and anxiety at 4. PHQ9=13. Currently not taking any medications. No issues or complaints. Taking notes in groups and learning new coping skills.

## 2020-06-18 ENCOUNTER — Other Ambulatory Visit (HOSPITAL_COMMUNITY): Payer: BC Managed Care – PPO | Admitting: Occupational Therapy

## 2020-06-18 ENCOUNTER — Encounter (HOSPITAL_COMMUNITY): Payer: Self-pay

## 2020-06-18 ENCOUNTER — Other Ambulatory Visit (HOSPITAL_COMMUNITY): Payer: BC Managed Care – PPO | Admitting: Licensed Clinical Social Worker

## 2020-06-18 ENCOUNTER — Other Ambulatory Visit: Payer: Self-pay

## 2020-06-18 DIAGNOSIS — R41844 Frontal lobe and executive function deficit: Secondary | ICD-10-CM

## 2020-06-18 DIAGNOSIS — F332 Major depressive disorder, recurrent severe without psychotic features: Secondary | ICD-10-CM | POA: Diagnosis not present

## 2020-06-18 DIAGNOSIS — F411 Generalized anxiety disorder: Secondary | ICD-10-CM

## 2020-06-18 DIAGNOSIS — R4589 Other symptoms and signs involving emotional state: Secondary | ICD-10-CM

## 2020-06-18 NOTE — Therapy (Signed)
[facility] PARTIAL HOSPITALIZATION PROGRAM [address] [place], [place], [address] Phone: [phone]   Fax:  [phone] Virtual Visit via Video Note  I connected with Stacy Randall on 06/18/20 at  11:00 AM EST by a video enabled telemedicine application and verified that I am speaking with the correct person using two identifiers.  Location: Patient: Patient Home Provider: Clinic Office    I discussed the limitations of evaluation and management by telemedicine and the availability of in person appointments. The patient expressed understanding and agreed to proceed.  I discussed the assessment and treatment plan with the patient. The patient was provided an opportunity to ask questions and all were answered. The patient agreed with the plan and demonstrated an understanding of the instructions.   The patient was advised to call back or seek an in-person evaluation if the symptoms worsen or if the condition fails to improve as anticipated.  I provided 60 minutes of non-face-to-face time during this encounter.   Donne Hazel, OT   Occupational Therapy Treatment  Patient Details  Name: Stacy Randall MRN: [id number] Date of Birth: 12-06-1995 Referring Provider (OT): Hillery Jacks   Encounter Date: 06/18/2020   OT End of Session - 06/18/20 1438    Visit Number 4    Number of Visits 20    Date for OT Re-Evaluation 07/11/20    Authorization Type [employer] - No co-pay  No visit limit  No auth req    OT Start Time 1100    OT Stop Time 1200    OT Time Calculation (min) 60 min    Activity Tolerance Patient tolerated treatment well    Behavior During Therapy [facility] for tasks assessed/performed           History reviewed. No pertinent past medical history.  History reviewed. No pertinent surgical history.  There were no vitals filed for this visit.   Subjective Assessment - 06/18/20 1437    Currently in Pain? No/denies            OT  Education - 06/18/20 1437    Education Details Educated on low vs high self-esteem, along with strategies to improve our self-worth with use of positive affirmations    Person(s) Educated Patient    Methods Explanation;Handout    Comprehension Verbalized understanding            OT Short Term Goals - 06/14/20 1223      OT SHORT TERM GOAL #1   Title Pt will actively engage in OT group sessions throughout duration of PHP programming, in order to promote daily structure, social engagement, and opportunities to develop and utilize adaptive strategies to maximize functional performance in preparation for safe transition and integration back into school, work, and the community.    Status On-going      OT SHORT TERM GOAL #2   Title Pt will practice and identify 1-3 adaptive coping strategies she can utilize, in order to safely manage increased depression/anxiety, with min cues, in preparation for safe and healthy reintegration back into the community at discharge.    Status On-going      OT SHORT TERM GOAL #3   Title Pt will identify 1-3 stress management strategies she can utilize, in order to safely manage increased psychosocial stressors identified, with min cues, in preparation for safe transition back to the community at discharge.    Status On-going         Group Session:  S: None noted*  O:  Today's group session encouraged increased engagement and participation through discussion focused on self-esteem. Topic of discussion focused on identifying differences between low vs high self-esteem and identified ways in which our self-esteem impacts our daily lives including: self-criticism, ignoring positive qualities, negative emotions, work/school performance, relationships, leisure engagement, and self-care. Group members further identified ways in which their self-esteem directly impacts their daily function. Tips and strategies were shared to boost and build-up our low self-esteem,  including exercise, mindfulness/meditation, postures of confidence, surrounding yourself with positive people, positive affirmations, and self-care.  A: Stacy Randall had difficulty engaging in today's group discussion, appeared more withdrawn and hesitant to engage than in previous encounters. Pt identified difficulty with her self-esteem, however could not identify anything specific or relate to topics offered by her peers. She appeared receptive to education received and appeared interested in looking more into exercises routines like yoga and meditation.   P: Continue to attend PHP OT group sessions 5x week for 2 weeks to promote daily structure, social engagement, and opportunities to develop and utilize adaptive strategies to maximize functional performance in preparation for safe transition and integration back into school, work, and the community.   Plan - 06/18/20 1439    Occupational performance deficits (Please refer to evaluation for details): ADL's;IADL's;Rest and Sleep;Work;Education;Leisure;Social Participation    Body Structure / Function / Physical Skills ADL    Cognitive Skills Attention;Consciousness;Emotional;Energy/Drive;Memory;Perception;Problem Solve;Safety Awareness;Temperament/Personality;Thought;Understand    Psychosocial Skills Coping Strategies;Environmental  Adaptations;Habits;Interpersonal Interaction;Routines and Behaviors           Patient will benefit from skilled therapeutic intervention in order to improve the following deficits and impairments:   Body Structure / Function / Physical Skills: ADL Cognitive Skills: Attention, Consciousness, Emotional, Energy/Drive, Memory, Perception, Problem Solve, Safety Awareness, Temperament/Personality, Thought, Understand Psychosocial Skills: Coping Strategies, Environmental  Adaptations, Habits, Interpersonal Interaction, Routines and Behaviors   Visit Diagnosis: Difficulty coping  Frontal lobe and executive function  deficit  Severe episode of recurrent major depressive disorder, without psychotic features (HCC)    Problem List Patient Active Problem List   Diagnosis Date Noted  . Premenstrual dysphoric disorder 02/07/2020    06/18/2020  Donne Hazel, MOT, OTR/L  06/18/2020, 2:40 PM  [facility] HOSPITALIZATION PROGRAM [address] [place], [place], [address] Phone: [phone]   Fax:  [phone]  Name: Stacy Randall MRN: [id number] Date of Birth: 05/22/1996

## 2020-06-19 ENCOUNTER — Other Ambulatory Visit: Payer: Self-pay

## 2020-06-19 ENCOUNTER — Encounter (HOSPITAL_COMMUNITY): Payer: Self-pay

## 2020-06-19 ENCOUNTER — Other Ambulatory Visit (HOSPITAL_COMMUNITY): Payer: BC Managed Care – PPO | Admitting: Licensed Clinical Social Worker

## 2020-06-19 ENCOUNTER — Other Ambulatory Visit (HOSPITAL_COMMUNITY): Payer: BC Managed Care – PPO | Admitting: Occupational Therapy

## 2020-06-19 DIAGNOSIS — F332 Major depressive disorder, recurrent severe without psychotic features: Secondary | ICD-10-CM

## 2020-06-19 DIAGNOSIS — R41844 Frontal lobe and executive function deficit: Secondary | ICD-10-CM

## 2020-06-19 DIAGNOSIS — F411 Generalized anxiety disorder: Secondary | ICD-10-CM

## 2020-06-19 DIAGNOSIS — R4589 Other symptoms and signs involving emotional state: Secondary | ICD-10-CM

## 2020-06-19 NOTE — Psych (Signed)
Virtual Visit via Video Note  I connected with Stacy Randall on 06/13/20 at  9:00 AM EDT by a video enabled telemedicine application and verified that I am speaking with the correct person using two identifiers.  Location: Patient: Patient home Provider: Clinical home office   I discussed the limitations of evaluation and management by telemedicine and the availability of in person appointments. The patient expressed understanding and agreed to proceed.  I discussed the assessment and treatment plan with the patient. The patient was provided an opportunity to ask questions and all were answered. The patient agreed with the plan and demonstrated an understanding of the instructions.   The patient was advised to call back or seek an in-person evaluation if the symptoms worsen or if the condition fails to improve as anticipated.  [name] and pt completed treatment plan and pt verbally states alignment. Pt verbally consents to treatment.   I provided 10 minutes of non-face-to-face time during this encounter.   Donia Guiles, LCSW

## 2020-06-19 NOTE — Psych (Signed)
Virtual Visit via Video Note  I connected with Stacy Randall on 06/13/20 at  9:00 AM EDT by a video enabled telemedicine application and verified that I am speaking with the correct person using two identifiers.  Location: Patient: Patient home Provider: Clinical home office   I discussed the limitations of evaluation and management by telemedicine and the availability of in person appointments. The patient expressed understanding and agreed to proceed.  I discussed the assessment and treatment plan with the patient. The patient was provided an opportunity to ask questions and all were answered. The patient agreed with the plan and demonstrated an understanding of the instructions.   The patient was advised to call back or seek an in-person evaluation if the symptoms worsen or if the condition fails to improve as anticipated.  Pt was provided 240 minutes of non-face-to-face time during this encounter.   Stacy Randall    [facility] PHP THERAPIST PROGRESS NOTE  Stacy Randall [id number]  Session Time: 9:00 - 10:00  Participation Level: Active  Behavioral Response: CasualAlertDepressed  Type of Therapy: Group Therapy  Treatment Goals addressed: Coping  Interventions: CBT, DBT, Supportive and Reframing  Summary: Clinician led check-in regarding current stressors and situation, and review of patient completed daily inventory. Clinician utilized active listening and empathetic response and validated patient emotions. Clinician facilitated processing group on pertinent issues.   Therapist Response: Stacy Randall is a 24 y.o. female who presents with depression symptoms. Patient arrived within time allowed and reports that she is feeling "fine." Patient rates hermood at a6 on a scale of 1-10 with 10 being great. Pt reportsher night was "bad" and she became overwhelmed and shut down. Pt states she went to bed early to manage. Pt able to process. Pt engaged in  discussion.     Session Time: 10:00 - 11:00   Participation Level:Active  Behavioral Response:CasualAlertDepressed  Type of Therapy:GroupTherapy  Treatment Goals addressed: Coping  Interventions:CBT, DBT, Supportive and Reframing  Summary: [name] led discussion on emotional reasoning and  provided context in terms of CBT unhealthy thought patterns. [name] encouraged pt's to be more specific in their speech and thought dialogues to highlight the presence of a feeling, a mutable and time limited experience. Group members were encouraged to use reminder: "feelings do not equal fact."   Therapist Response: Pt engaged in discussion and is able to identify patterns in which they struggle to differentiate feelings and fact.      Session Time: 11:00- 12:00  Participation Level:Active  Behavioral Response:CasualAlertDepressed  Type of Therapy: Group Therapy, OT  Treatment Goals addressed: Coping  Interventions:Psychosocial skills training, Supportive  Summary:Occupational Therapy group  Therapist Response:Patient engaged in group. See OT note.      Session Time: 12:00 -1:00  Participation Level:Active  Behavioral Response:CasualAlertDepressed  Type of Therapy:Group therapy  Treatment Goals addressed: Coping  Interventions:CBT; Solution focused; Supportive; Reframing  Summary:12:00 - 12:50:[name] continued topic of DBT distress tolerance skills and the ACCEPTS distraction skill. Group reviewed P-T-S skills and discussed how they can practice them in their every day life.  practice challenging distorted thinking.  12:50 -1:00 Clinician led check-out. Clinician assessed for immediate needs, medication compliance and efficacy, and safety concerns  Therapist Response:12:00 - 12:50: Pt engaged in discussion and reports she is most likely to practice the "T" skill.  12:50 - 1:00: At check-out, patientrates hermood at [facility] on a  scale of 1-10 with 10 being great.Pt reports afternoon plans ofunpacking. Pt demonstrates some progress as evidenced by participating  in first group session. Patient denies SI/HI/self-harm at the end of group.    Suicidal/Homicidal: Nowithout intent/plan  Plan: Pt will continue in PHP while working to decrease depression and anxiety symptoms, increase ability to manage symptoms, in a healthy manner, and eliminate SI.   Diagnosis: Severe episode of recurrent major depressive disorder, without psychotic features (HCC) [F33.2]    1. Severe episode of recurrent major depressive disorder, without psychotic features (HCC)   2. GAD (generalized anxiety disorder)       Donia Guiles, LCSW 06/19/2020

## 2020-06-19 NOTE — Therapy (Signed)
[facility] PARTIAL HOSPITALIZATION PROGRAM [address] [place], [place], [address] Phone: [phone]   Fax:  [phone] Virtual Visit via Video Note  I connected with [name] on 06/19/20 at  11:00 AM EST by a video enabled telemedicine application and verified that I am speaking with the correct person using two identifiers.  Location: Patient: Patient Home Provider: Clinic Office    I discussed the limitations of evaluation and management by telemedicine and the availability of in person appointments. The patient expressed understanding and agreed to proceed.     I discussed the assessment and treatment plan with the patient. The patient was provided an opportunity to ask questions and all were answered. The patient agreed with the plan and demonstrated an understanding of the instructions.   The patient was advised to call back or seek an in-person evaluation if the symptoms worsen or if the condition fails to improve as anticipated.  I provided 60 minutes of non-face-to-face time during this encounter.   Donne Hazel, OT   Occupational Therapy Treatment  Patient Details  Name: Stacy Randall MRN: [id number] Date of Birth: May 13, 1996 Referring Provider (OT): Hillery Jacks   Encounter Date: 06/19/2020   OT End of Session - 06/19/20 1441    Visit Number 5    Number of Visits 20    Date for OT Re-Evaluation 07/11/20    Authorization Type [employer] - No co-pay  No visit limit  No auth req    OT Start Time 1105    OT Stop Time 1205    OT Time Calculation (min) 60 min    Activity Tolerance Patient tolerated treatment well    Behavior During Therapy [facility] for tasks assessed/performed           History reviewed. No pertinent past medical history.  History reviewed. No pertinent surgical history.  There were no vitals filed for this visit.   Subjective Assessment - 06/19/20 1435    Currently in Pain? No/denies            OT  Education - 06/19/20 1436    Education Details Educated on concept of sensory modulation and self-soothing as coping strategies through use of the five senses    Person(s) Educated Patient    Methods Explanation;Handout    Comprehension Verbalized understanding            OT Short Term Goals - 06/14/20 1223      OT SHORT TERM GOAL #1   Title Pt will actively engage in OT group sessions throughout duration of PHP programming, in order to promote daily structure, social engagement, and opportunities to develop and utilize adaptive strategies to maximize functional performance in preparation for safe transition and integration back into school, work, and the community.    Status On-going      OT SHORT TERM GOAL #2   Title Pt will practice and identify 1-3 adaptive coping strategies she can utilize, in order to safely manage increased depression/anxiety, with min cues, in preparation for safe and healthy reintegration back into the community at discharge.    Status On-going      OT SHORT TERM GOAL #3   Title Pt will identify 1-3 stress management strategies she can utilize, in order to safely manage increased psychosocial stressors identified, with min cues, in preparation for safe transition back to the community at discharge.    Status On-going         Group Session:  S: "I like  going for a drive and listening to music with the windows rolled down."  O: Today's group session focused on topic of sensory modulation and self-soothing through use of the 8 senses. Discussion introduced the concept of sensory modulation and integration, focusing on how we can utilize our body and it's senses to self-soothe or cope, when we are experiencing an over or under-whelming sensation or feeling. Group members were introduced to a sensory diet checklist as a helpful tool/resource that can be utilized to identify what activities and strategies we prefer and do not prefer based upon our response to  different stimulus. The concept of alerting vs calming activities was also introduced to understand how to counteract how we are feeling (Example: when we are feeling overwhelmed/stressed, engage in something calming. When we are feeling depressed/low energy, engage in something alerting). Group members engaged actively in discussion sharing their own personal sensory likes/dislikes.    A: Bliss was active and independent in her participation of discussion/activity, sharing that she likes to go for a drive and listen to music with the windows down as a way to calm herself down. She shared that when she experiences anxiety, she drives faster, though recognized that this is not safe and was interested in learning additional strategies. Pt identified taking a warm bath and lighting scented candles as another strategy to calm herself down.   P: Continue to attend PHP OT group sessions 5x week for 2 weeks to promote daily structure, social engagement, and opportunities to develop and utilize adaptive strategies to maximize functional performance in preparation for safe transition and integration back into school, work, and the community. Plan to address topic of health and wellness in next OT group session.   Plan - 06/19/20 1441    Occupational performance deficits (Please refer to evaluation for details): ADL's;IADL's;Rest and Sleep;Work;Education;Leisure;Social Participation    Body Structure / Function / Physical Skills ADL    Cognitive Skills Attention;Consciousness;Emotional;Energy/Drive;Memory;Perception;Problem Solve;Safety Awareness;Temperament/Personality;Thought;Understand    Psychosocial Skills Coping Strategies;Environmental  Adaptations;Habits;Interpersonal Interaction;Routines and Behaviors           Patient will benefit from skilled therapeutic intervention in order to improve the following deficits and impairments:   Body Structure / Function / Physical Skills: ADL Cognitive Skills:  Attention, Consciousness, Emotional, Energy/Drive, Memory, Perception, Problem Solve, Safety Awareness, Temperament/Personality, Thought, Understand Psychosocial Skills: Coping Strategies, Environmental  Adaptations, Habits, Interpersonal Interaction, Routines and Behaviors   Visit Diagnosis: Difficulty coping  Frontal lobe and executive function deficit  Severe episode of recurrent major depressive disorder, without psychotic features (HCC)    Problem List Patient Active Problem List   Diagnosis Date Noted  . Premenstrual dysphoric disorder 02/07/2020    06/19/2020  Donne Hazel, MOT, OTR/L  06/19/2020, 2:41 PM  [facility] PARTIAL HOSPITALIZATION PROGRAM [address] [place], [place], [address] Phone: [phone]   Fax:  [phone]  Name: Julliette Frentz MRN: [id number] Date of Birth: April 03, 1996

## 2020-06-20 ENCOUNTER — Encounter (HOSPITAL_COMMUNITY): Payer: Self-pay

## 2020-06-20 ENCOUNTER — Other Ambulatory Visit (INDEPENDENT_AMBULATORY_CARE_PROVIDER_SITE_OTHER): Payer: BC Managed Care – PPO | Admitting: Licensed Clinical Social Worker

## 2020-06-20 ENCOUNTER — Telehealth (HOSPITAL_COMMUNITY): Payer: Self-pay | Admitting: Psychiatry

## 2020-06-20 ENCOUNTER — Other Ambulatory Visit: Payer: Self-pay

## 2020-06-20 ENCOUNTER — Other Ambulatory Visit (HOSPITAL_COMMUNITY): Payer: BC Managed Care – PPO | Admitting: Occupational Therapy

## 2020-06-20 DIAGNOSIS — F332 Major depressive disorder, recurrent severe without psychotic features: Secondary | ICD-10-CM

## 2020-06-20 DIAGNOSIS — R4589 Other symptoms and signs involving emotional state: Secondary | ICD-10-CM

## 2020-06-20 DIAGNOSIS — R41844 Frontal lobe and executive function deficit: Secondary | ICD-10-CM

## 2020-06-20 DIAGNOSIS — F411 Generalized anxiety disorder: Secondary | ICD-10-CM

## 2020-06-20 NOTE — Therapy (Signed)
[facility] PARTIAL HOSPITALIZATION PROGRAM [address] [place], [place], [address] Phone: [phone]   Fax:  [phone] Virtual Visit via Video Note  I connected with Stacy Randall on 06/20/20 at  12:00 PM EST by a video enabled telemedicine application and verified that I am speaking with the correct person using two identifiers.  Location: Patient: Patient Home Provider: Clinic Office    I discussed the limitations of evaluation and management by telemedicine and the availability of in person appointments. The patient expressed understanding and agreed to proceed.   I discussed the assessment and treatment plan with the patient. The patient was provided an opportunity to ask questions and all were answered. The patient agreed with the plan and demonstrated an understanding of the instructions.   The patient was advised to call back or seek an in-person evaluation if the symptoms worsen or if the condition fails to improve as anticipated.  I provided 60 minutes of non-face-to-face time during this encounter.   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 No pertinent past medical history.  History reviewed. No pertinent surgical history.  There were no vitals filed for this visit.   Subjective Assessment - 06/20/20 1341    Currently in Pain? No/denies             OT  Education - 06/20/20 1341    Education Details Educated on brain fitness and healthy distractions as use for positive coping    Person(s) Educated Patient    Methods Explanation;Handout    Comprehension Verbalized understanding            OT Short Term Goals - 06/14/20 1223      OT SHORT TERM GOAL #1   Title Pt will actively engage in OT group sessions throughout duration of PHP programming, in order to promote daily structure, social engagement, and opportunities to develop and utilize adaptive strategies to maximize functional performance in preparation for safe transition and integration back into school, work, and the community.    Status On-going      OT SHORT TERM GOAL #2   Title Pt will practice and identify 1-3 adaptive coping strategies she can utilize, in order to safely manage increased depression/anxiety, with min cues, in preparation for safe and healthy reintegration back into the community at discharge.    Status On-going      OT SHORT TERM GOAL #3   Title Pt will identify 1-3 stress management strategies she can utilize, in order to safely manage increased psychosocial stressors identified, with min cues, in preparation for safe transition back to the community at discharge.    Status On-going         Group Session:  S: "I thought it was a really helpful activity  to distract myself."  O: Group began with a reflection from previous OT session focused on sensory modulation and self-soothing through the 8 senses. Today's group discussion focused on brain fitness as a healthy distraction and positive coping strategy. Group members engaged in three different brain fitness activities during session and identified their levels of difficulty and distraction. Members also identified additional brain fitness activities they could engage in on their own time as healthy distractions.   A: Shandria was active and independent in her participation of discussion and activities  presented. She identified feeling distracted for the group session and noted benefit of use of brain fitness strategies. She identified engaging in [place] on her phone as a brain fitness coping skill she uses frequently, however identified being alone as something that is difficult for her. She shared that during these times it would be helpful to engage in a brain fitness distraction. This Clinical research associate checked with patient before sign-off and pt denied any safety concerns or increased depression/anxiety as she transitioned into the afternoon.   P: Continue to attend PHP OT group sessions 5x week for 2 weeks to promote daily structure, social engagement, and opportunities to develop and utilize adaptive strategies to maximize functional performance in preparation for safe transition and integration back into school, work, and the community. Plan to address topic of sleep hygiene in next OT group session.   Plan - 06/20/20 1342    Occupational performance deficits (Please refer to evaluation for details): ADL's;IADL's;Rest and Sleep;Work;Education;Leisure;Social Participation    Body Structure / Function / Physical Skills ADL    Cognitive Skills Attention;Consciousness;Emotional;Energy/Drive;Memory;Perception;Problem Solve;Safety Awareness;Temperament/Personality;Thought;Understand    Psychosocial Skills Coping Strategies;Environmental  Adaptations;Habits;Interpersonal Interaction;Routines and Behaviors           Patient will benefit from skilled therapeutic intervention in order to improve the following deficits and impairments:   Body Structure / Function / Physical Skills: ADL Cognitive Skills: Attention, Consciousness, Emotional, Energy/Drive, Memory, Perception, Problem Solve, Safety Awareness, Temperament/Personality, Thought, Understand Psychosocial Skills: Coping Strategies, Environmental  Adaptations, Habits, Interpersonal Interaction, Routines and Behaviors   Visit Diagnosis: Difficulty  coping  Frontal lobe and executive function deficit  Severe episode of recurrent major depressive disorder, without psychotic features (HCC)    Problem List Patient Active Problem List   Diagnosis Date Noted  . Premenstrual dysphoric disorder 02/07/2020    06/20/2020  Donne Hazel, MOT, OTR/L  06/20/2020, 1:42 PM  [facility] HOSPITALIZATION PROGRAM [address] [place], [place], [address] Phone: [phone]   Fax:  [phone]  Name: Stacy Randall MRN: [id number] Date of Birth: [birth year]

## 2020-06-20 NOTE — Progress Notes (Signed)
Virtual Visit via Video Note   I connected with Stacy Randall on 06/20/20 at 9:30 AM EDT by a video enabled telemedicine application and verified that I am speaking with the correct person using two identifiers.   Location: Patient: Patient home Provider: OPT BH Office   I discussed the limitations of evaluation and management by telemedicine and the availability of in person appointments. The patient expressed understanding and agreed to proceed.   I discussed the assessment and treatment plan with the patient. The patient was provided an opportunity to ask questions and all were answered. The patient agreed with the plan and demonstrated an understanding of the instructions.   The patient was advised to call back or seek an in-person evaluation if the symptoms worsen or if the condition fails to improve as anticipated.   Pt was provided 210 minutes of non-face-to-face time during this encounter.     Noralee Stain, Clare Charon       [facility] PHP THERAPIST PROGRESS NOTE   Nahiara Kretzschmar [id number]   Session Time: 9:30am - 10:00am   Participation Level: Active   Behavioral Response: CasualAlertDepressed   Type of Therapy: Group Therapy   Treatment Goals addressed: Coping   Interventions: CBT   Summary: Counselor facilitated a check-in with group members to gauge mood and current functioning as well as identify recent progress towards treatment goals.     Therapist Response: Aahana Elza is a 24 y.o. female who presents with depressive and anxiety symptoms. Shwanda presented to group session 30 minutes late due to technical difficulties, but was alert, oriented x5, with no evidence or self-report of current SI/HI or A/V H.  Dniyah reported scores of 4/10 for depression and 2/10 for anxiety today. Monzerat reported that her struggle over past day was isolating herself and finding it difficult to express her feelings when asked by parents what was wrong.  She reported that a  success was using coping skills more often, such as meditating by focusing on a candle for 5 minutes and trying to clear her mind of troubling thoughts and making effort to find positives no matter how bad a situation might seem.        Session Time: 10:00am - 11:00am    Participation Level: Active   Behavioral Response: CasualAlertDepressed   Type of Therapy: Group Therapy   Treatment Goals addressed: Coping   Interventions: Grief counseling    Summary: Counselor introduced Sheppard Coil, [facility] to present information and discussion on Grief and Loss. Group members engaged in discussion, sharing how grief impacts them, what comforts them, what emotions are felt, labeling losses, etc.     Therapist Response: Kellen reported that this topic made her think of her grandmother, who passed 4 years ago, and she continues to grieve for.  Sarye reported that her grandmother was her closest support that she felt she could talk about anything with, and stated "Its like there is a huge dent in my heart, and it still makes me sad to think about sometimes".  Burnadette reported that her pet can sometimes sense when she is grieving, and offer support through company.          Session Time: 11:00am -12:00pm   Participation Level: Active   Behavioral Response: CasualAlertDepressed   Type of Therapy: Group therapy   Treatment Goals addressed: Coping   Interventions: CBT; Solution focused   Summary: Counselor introduced topic of creating mental health maintenance plan today.  Counselor provided handout on subject to members,  which stressed the importance of maintaining one's mental health in a similar way to using diet and exercise to ensure physical health.  Counselor walked members through process of identifying triggers which could worsen symptoms, including specific people, places, and things one needs to avoid.  Members were also tasked with identifying warning signs such as thoughts,  feelings, or behaviors which could indicate mental health is at increased risk.  Counselor also facilitated conversation on self-care activities and coping strategies which members have previously utilized in the past, are currently using in daily routine, or plan to use soon to assist with managing problems or symptoms when/if they appear.  Counselor encouraged members to revisit their maintenance plan often and make changes as needed to ensure day to day stability.     Therapist Response: Interventions were effective, as evidenced by [facility] participating in activity, reporting that her triggers tend to be internal, such as feelings of loneliness, stress, and abandonment.  She reported that transition and change can also be difficult, as new job has been overwhelming and led to seeking therapy to improve coping ability.  Veronia reported that warning signs which could indicate worsening mental health include sleeping excessively and stagnation, along with fatigue, stating "I need to avoid letting my mind take over my body when I feel that way".  She reported that self-care activities which help include getting outside of her home, taking walks, and getting vitamin D from the sun, along with taking car rides to relaxing places like the mountains.  She reported that her boss from work has been particularly helpful in checking on her during recent transitions, and will encourage her to take breaks outside and practice her breathing.          Session Time: 12:00pm - 1:00pm   Participation Level: Active   Behavioral Response: CasualAlertDepressed   Type of Therapy: Group Therapy, OT   Treatment Goals addressed: Coping   Interventions: Psychosocial skills training, Supportive   Summary: Occupational Therapy group   Therapist Response: Patient engaged in group. See OT note.      Suicidal/Homicidal: Nowithout intent/plan   Plan: Pt will continue in PHP while working to decrease depression and  anxiety symptoms, increase ability to manage symptoms, in a healthy manner, and eliminate SI.    Diagnosis:      Severe episode of recurrent major depressive disorder, without psychotic features (HCC) [F33.2]                          1. Severe episode of recurrent major depressive disorder, without psychotic features (HCC)   2. GAD (generalized anxiety disorder)           Noralee Stain, LCSW, LCAS 06/20/2020

## 2020-06-21 ENCOUNTER — Encounter (HOSPITAL_COMMUNITY): Payer: Self-pay | Admitting: Psychiatry

## 2020-06-21 ENCOUNTER — Other Ambulatory Visit (HOSPITAL_COMMUNITY): Payer: BC Managed Care – PPO | Admitting: Psychiatry

## 2020-06-21 ENCOUNTER — Other Ambulatory Visit (HOSPITAL_COMMUNITY): Payer: BC Managed Care – PPO | Admitting: Licensed Clinical Social Worker

## 2020-06-21 ENCOUNTER — Encounter (HOSPITAL_COMMUNITY): Payer: Self-pay

## 2020-06-21 ENCOUNTER — Other Ambulatory Visit: Payer: Self-pay

## 2020-06-21 DIAGNOSIS — R41844 Frontal lobe and executive function deficit: Secondary | ICD-10-CM

## 2020-06-21 DIAGNOSIS — F332 Major depressive disorder, recurrent severe without psychotic features: Secondary | ICD-10-CM | POA: Diagnosis not present

## 2020-06-21 DIAGNOSIS — R4589 Other symptoms and signs involving emotional state: Secondary | ICD-10-CM

## 2020-06-21 DIAGNOSIS — F411 Generalized anxiety disorder: Secondary | ICD-10-CM

## 2020-06-21 NOTE — Progress Notes (Signed)
Virtual Visit via Video Note  I connected with Stacy Randall on 06/21/20 at  9:00 AM EST by a video enabled telemedicine application and verified that I am speaking with the correct person using two identifiers.   Location:  Patient: Patient Home Provider: Home Office   History of Present Illness: MDD and GAD  Observations/Objective: Check In: Case Manager checked in with all participants to review discharge dates, insurance authorizations, work-related documents and needs for the treatment team. Counselor facilitated a check-in with group members to assess mood and current functioning. Client reports that she is doing ok today and engaged well in the session. Client presents with moderate depression and moderate anxiety. Client denied any current SI/HI/psychosis.   Initial Therapeutic Activity: Counselor provided information on the programs and services of [facility] to inform group on access to ongoing care after treatment programs have ended. Counselor shared about peer support, support groups, [facility] and [employer]. Client stated that they are most interested in Grief and Loss Support Group.  Second Therapeutic Activity: Counselor prompted group to reflect and document how their depression and anxiety differ and are the same. Client noted that theirs overlap by issues with sleep and focus. Counselor introduced and played a video by Stevan Born, LMFT discussing the impact of avoidance in causing depression and anxiety to grow, along with strategies to combat avoidance. Client noted that she wants to avoid less and make better life choices to enjoy life more.  Check Out: Counselor prompted group members to share their plans for implementing self-care or to engage in a productivity activity. Client plans to go out to eat with a friend manage mental health symptoms over the weekend. Counselor closed program by allowing time to celebrate a graduating group  member. Counselor shared reflections on progress and allow space for group members to share well wishes and appreciates to the graduating client. Counselor prompted graduating client to share takeaways, reflect on progress and final thoughts for the group. Client indicated that they are not currently a risk to self of others before signing off.  Assessment and Plan: Clinician recommends that Client remain in PHP treatment to better manage mental health symptoms, stabilization and to address treatment plan goals. Clinician recommends adherence to crisis/safety plan, taking medications as prescribed, and following up with medical professionals if any issues arise.   Follow Up Instructions: Clinician will send Webex link for next session. The Client was advised to call back or seek an in-person evaluation if the symptoms worsen or if the condition fails to improve as anticipated.     I provided 180 minutes of non-face-to-face time during this encounter.     Hilbert Odor, LCSW

## 2020-06-21 NOTE — Therapy (Signed)
[facility] PARTIAL HOSPITALIZATION PROGRAM [address] [place], [place], [address] Phone: [phone]   Fax:  [phone] Virtual Visit via Video Note  I connected with [name] on 06/21/20 at  12:00 PM EST by a video enabled telemedicine application and verified that I am speaking with the correct person using two identifiers.  Location: Patient: Patient Home Provider: Clinic Office    I discussed the limitations of evaluation and management by telemedicine and the availability of in person appointments. The patient expressed understanding and agreed to proceed.  I discussed the assessment and treatment plan with the patient. The patient was provided an opportunity to ask questions and all were answered. The patient agreed with the plan and demonstrated an understanding of the instructions.   The patient was advised to call back or seek an in-person evaluation if the symptoms worsen or if the condition fails to improve as anticipated.  I provided 50 minutes of non-face-to-face time during this encounter.   Donne Hazel, OT   Occupational Therapy Treatment  Patient Details  Name: Martinique Pizzimenti MRN: [id number] Date of Birth: [birth year] Referring Provider (OT): Hillery Jacks   Encounter Date: 06/21/2020   OT End of Session - 06/21/20 1415    Visit Number 7    Number of Visits 20    Date for OT Re-Evaluation 07/11/20    Authorization Type [employer]BS - No co-pay  No visit limit  No auth req    OT Start Time 1210    OT Stop Time 1300    OT Time Calculation (min) 50 min    Activity Tolerance Patient tolerated treatment well    Behavior During Therapy [facility] for tasks assessed/performed           History reviewed. No pertinent past medical history.  History reviewed. No pertinent surgical history.  There were no vitals filed for this visit.   Subjective Assessment - 06/21/20 1414    Currently in Pain? No/denies           OT  Education - 06/21/20 1414    Education Details Educated on weekend planning and coping ahead    Person(s) Educated Patient    Methods Explanation    Comprehension Verbalized understanding            OT Short Term Goals - 06/14/20 1223      OT SHORT TERM GOAL #1   Title Pt will actively engage in OT group sessions throughout duration of PHP programming, in order to promote daily structure, social engagement, and opportunities to develop and utilize adaptive strategies to maximize functional performance in preparation for safe transition and integration back into school, work, and the community.    Status On-going      OT SHORT TERM GOAL #2   Title Pt will practice and identify 1-3 adaptive coping strategies she can utilize, in order to safely manage increased depression/anxiety, with min cues, in preparation for safe and healthy reintegration back into the community at discharge.    Status On-going      OT SHORT TERM GOAL #3   Title Pt will identify 1-3 stress management strategies she can utilize, in order to safely manage increased psychosocial stressors identified, with min cues, in preparation for safe transition back to the community at discharge.    Status On-going         Group Session:  S: "I think I am going to get sushi with my friend Jonny Ruiz this weekend. I have  never tried it before."  O: Today's session focused on topic of Coping Ahead and group members explored strategies and coping skills they could engage in over the weekend. Group members were encouraged to identify different feelings/emotions they have difficulty managing and looked at strategies they could plan to use, should these feelings come up over the weekend. Group members shared their responses and offered alternative strategies and suggestions to their peers.   A: Lamia was active in her participation of discussion and activity. She shared that she was going to go out for sushi with a friend this weekend and  that she has never had sushi before. She shared that she thinks she is pretty adventurous with trying new things, though admits she is nervous about getting sick from eating raw fish. She shared she enjoys grilled shrimp, however otherwise has limited experience with seafood.She also shared that she might go to [facility] and get a peppermint mocha frappuchino as a self-care activity and hang out with a friend to avoid feeling of loneliness.   P: Continue to attend PHP OT group sessions 5x week for 1 week to promote daily structure, social engagement, and opportunities to develop and utilize adaptive strategies to maximize functional performance in preparation for safe transition and integration back into school, work, and the community.    Plan - 06/21/20 1415    Occupational performance deficits (Please refer to evaluation for details): ADL's;IADL's;Rest and Sleep;Work;Education;Leisure;Social Participation    Body Structure / Function / Physical Skills ADL    Cognitive Skills Attention;Consciousness;Emotional;Energy/Drive;Memory;Perception;Problem Solve;Safety Awareness;Temperament/Personality;Thought;Understand    Psychosocial Skills Coping Strategies;Environmental  Adaptations;Habits;Interpersonal Interaction;Routines and Behaviors           Patient will benefit from skilled therapeutic intervention in order to improve the following deficits and impairments:   Body Structure / Function / Physical Skills: ADL Cognitive Skills: Attention, Consciousness, Emotional, Energy/Drive, Memory, Perception, Problem Solve, Safety Awareness, Temperament/Personality, Thought, Understand Psychosocial Skills: Coping Strategies, Environmental  Adaptations, Habits, Interpersonal Interaction, Routines and Behaviors   Visit Diagnosis: Difficulty coping  Frontal lobe and executive function deficit  Severe episode of recurrent major depressive disorder, without psychotic features (HCC)    Problem  List Patient Active Problem List   Diagnosis Date Noted  . Premenstrual dysphoric disorder 02/07/2020    06/21/2020  Donne Hazel, MOT, OTR/L  06/21/2020, 2:19 PM  [facility] PARTIAL HOSPITALIZATION PROGRAM [address] [place], [place], [address] Phone: [phone]   Fax:  [phone]  Name: Winnona Wargo MRN: [id number] Date of Birth: 29-Mar-1996

## 2020-06-24 ENCOUNTER — Encounter (HOSPITAL_COMMUNITY): Payer: Self-pay

## 2020-06-24 ENCOUNTER — Other Ambulatory Visit (HOSPITAL_COMMUNITY): Payer: BC Managed Care – PPO | Admitting: Licensed Clinical Social Worker

## 2020-06-24 ENCOUNTER — Other Ambulatory Visit: Payer: Self-pay

## 2020-06-24 ENCOUNTER — Other Ambulatory Visit (HOSPITAL_COMMUNITY): Payer: BC Managed Care – PPO | Admitting: Occupational Therapy

## 2020-06-24 DIAGNOSIS — F332 Major depressive disorder, recurrent severe without psychotic features: Secondary | ICD-10-CM

## 2020-06-24 DIAGNOSIS — R41844 Frontal lobe and executive function deficit: Secondary | ICD-10-CM

## 2020-06-24 DIAGNOSIS — R4589 Other symptoms and signs involving emotional state: Secondary | ICD-10-CM

## 2020-06-24 DIAGNOSIS — F411 Generalized anxiety disorder: Secondary | ICD-10-CM

## 2020-06-24 NOTE — Progress Notes (Signed)
Virtual Visit via Video Note  I connected with Onisha Herbster on 06/24/20 at  9:00 AM EST by a video enabled telemedicine application and verified that I am speaking with the correct person using two identifiers.   Location:  Patient: Patient Home Provider: Home Office   History of Present Illness: MDD and GAD  Observations/Objective: Check In: Case Manager checked in with all participants to review discharge dates, insurance authorizations, work-related documents and needs for the treatment team. Counselor facilitated a check-in with group members to assess mood and current functioning. Client reports that she had a "good and busy weekend". Client shared about intentional time spent with friends and how it positively impacted her mental health, serving as a distraction from intrusive thoughts and feelings. Client presents with moderate depression and moderate anxiety. Client denied any current SI/HI/psychosis.   Initial Therapeutic Activity: Counselor engaged group in creating an ECOMap of their current connections and relationships, including parts of themselves, with others and with entities the interact with. Counselor added components of relationship dynamics, energy lines and boundaries. Counselor prompted group members to share their work verbally with the group, naming themes, trends and areas they wish to improve. Client stated that work is the most stressful relationship she has at this time. Client requested feedback from the group to better communicate needs with coworkers to be more confident in her position.  Second Therapeutic Activity: Counselor provided group members information from 2 websites, one with a list of Words of Affirmations for their significant others and another with positive affirmations. Counselor challenged group members to choose 3-5 positive affirmations to write down and to review with self over the next week. Group members shared which phrases resonated with  them most, with Client choosing affirmations related to confidence and motivation.  Check Out: Counselor prompted group members to share their plans for implementing self-care or to engage in a productivity activity. Client plans rest to manage mental health symptoms today.   Assessment and Plan: Clinician recommends that Client remain in PHP treatment to better manage mental health symptoms, stabilization and to address treatment plan goals. Clinician recommends adherence to crisis/safety plan, taking medications as prescribed, and following up with medical professionals if any issues arise.   Follow Up Instructions: Clinician will send Webex link for next session. The Client was advised to call back or seek an in-person evaluation if the symptoms worsen or if the condition fails to improve as anticipated.     I provided 180 minutes of non-face-to-face time during this encounter.     Hilbert Odor, LCSW

## 2020-06-24 NOTE — Therapy (Signed)
[facility] PARTIAL HOSPITALIZATION PROGRAM [address] [place], [place], [address] Phone: [phone]   Fax:  [phone] Virtual Visit via Video Note  I connected with Stacy Randall on 06/24/20 at  12:00 PM EST by a video enabled telemedicine application and verified that I am speaking with the correct person using two identifiers.  Location: Patient: Patient Home Provider: Clinic Office    I discussed the limitations of evaluation and management by telemedicine and the availability of in person appointments. The patient expressed understanding and agreed to proceed.   I discussed the assessment and treatment plan with the patient. The patient was provided an opportunity to ask questions and all were answered. The patient agreed with the plan and demonstrated an understanding of the instructions.   The patient was advised to call back or seek an in-person evaluation if the symptoms worsen or if the condition fails to improve as anticipated.  I provided 60 minutes of non-face-to-face time during this encounter.   Stacy Randall, OT   Occupational Therapy Treatment  Patient Details  Name: Stacy Randall MRN: [id number] Date of Birth: 08-Nov-1995 Referring Provider (OT): Hillery Jacks   Encounter Date: 06/24/2020   OT End of Session - 06/24/20 1329    Visit Number 8    Number of Visits 20    Date for OT Re-Evaluation 07/11/20    Authorization Type BCBS - No co-pay  No visit limit  No auth req    OT Start Time 1200    OT Stop Time 1300    OT Time Calculation (min) 60 min    Activity Tolerance Patient tolerated treatment well    Behavior During Therapy [name] for tasks assessed/performed           History reviewed. No pertinent past medical history.  History reviewed. No pertinent surgical history.  There were no vitals filed for this visit.   Subjective Assessment - 06/24/20 1329    Currently in Pain? No/denies           OT  Education - 06/24/20 1329    Education Details Educated on tips and strategies to improve overall self-care    Person(s) Educated Patient    Methods Explanation;Handout    Comprehension Verbalized understanding            OT Short Term Goals - 06/14/20 1223      OT SHORT TERM GOAL #1   Title Pt will actively engage in OT group sessions throughout duration of PHP programming, in order to promote daily structure, social engagement, and opportunities to develop and utilize adaptive strategies to maximize functional performance in preparation for safe transition and integration back into school, work, and the community.    Status On-going      OT SHORT TERM GOAL #2   Title Pt will practice and identify 1-3 adaptive coping strategies she can utilize, in order to safely manage increased depression/anxiety, with min cues, in preparation for safe and healthy reintegration back into the community at discharge.    Status On-going      OT SHORT TERM GOAL #3   Title Pt will identify 1-3 stress management strategies she can utilize, in order to safely manage increased psychosocial stressors identified, with min cues, in preparation for safe transition back to the community at discharge.    Status On-going         Group Session:  S: "Self-care is putting yourself first above all other things and taking care of  your own needs."  O: Group began with a warm-up and reflected on the events from their weekend. Group members were encouraged to identify one self-care activity they engaged in over the weekend. Today's group session focused on topic of self-care and group members identified their definitions of what self-care means to them, along with recognizing the difference between self-care and selfishness. Members worked through all five subcategories of self-care including physical, emotional/psychological, social, spiritual, and professional self-care. Discussion focused on members sharing which areas  they need improvement in and which areas they identified as strengths. Members worked together to [employer] and tips on improving one's self-care with the thought that what someone identifies as their "best self" changes day-to-day contingent upon mood, emotions, and situations. Members identified both one "big" and one "small" self-care activity they can and would like to engage in the near future.   A: Stacy Randall was active and independent in her participation of discussion and activity, overall identifying "spiritual self-care" as the sub category she needs the most improvement in. She identified her self-care strengths as getting enough sleep, engaging in comforting activities, spending time with people she likes, and learning something new/challenging at work. She identified her weaknesses in self-care as "not eating healthy foods, not taking enough vacation/day trips, difficulty meeting new people, prayer/overall spiritual self care, and taking on new projects/responsibilities at work. Stacy Randall appeared receptive and open to education received, along with additional strategies suggested by peer and group leader. Identified intent to work on spiritual self care as biggest area of improvement.   P: Continue to attend PHP OT group sessions 5x week for 1 week to promote daily structure, social engagement, and opportunities to develop and utilize adaptive strategies to maximize functional performance in preparation for safe transition and integration back into school, work, and the community. Plan to address topic of health and wellness in next OT group session.    Plan - 06/24/20 1329    Occupational performance deficits (Please refer to evaluation for details): ADL's;IADL's;Rest and Sleep;Work;Education;Leisure;Social Participation    Body Structure / Function / Physical Skills ADL    Cognitive Skills Attention;Consciousness;Emotional;Energy/Drive;Memory;Perception;Problem Solve;Safety  Awareness;Temperament/Personality;Thought;Understand    Psychosocial Skills Coping Strategies;Environmental  Adaptations;Habits;Interpersonal Interaction;Routines and Behaviors           Patient will benefit from skilled therapeutic intervention in order to improve the following deficits and impairments:   Body Structure / Function / Physical Skills: ADL Cognitive Skills: Attention, Consciousness, Emotional, Energy/Drive, Memory, Perception, Problem Solve, Safety Awareness, Temperament/Personality, Thought, Understand Psychosocial Skills: Coping Strategies, Environmental  Adaptations, Habits, Interpersonal Interaction, Routines and Behaviors   Visit Diagnosis: Difficulty coping  Frontal lobe and executive function deficit  Severe episode of recurrent major depressive disorder, without psychotic features (HCC)    Problem List Patient Active Problem List   Diagnosis Date Noted  . Premenstrual dysphoric disorder 02/07/2020    06/24/2020  Stacy Randall, MOT, OTR/L  06/24/2020, 1:30 PM  [facility] HOSPITALIZATION PROGRAM [address] [place], [place], [address] Phone: [phone]   Fax:  [phone]  Name: Stacy Randall MRN: [id number] Date of Birth: [birth year]

## 2020-06-24 NOTE — Progress Notes (Signed)
Virtual Visit via Video Note  I connected with [name] on 06/24/20 at  9:00 AM EST by a video enabled telemedicine application and verified that I am speaking with the correct person using two identifiers.  Location: Patient: home Provider: office PHP   I discussed the limitations of evaluation and management by telemedicine and the availability of in person appointments. The patient expressed understanding and agreed to proceed.   I discussed the assessment and treatment plan with the patient. The patient was provided an opportunity to ask questions and all were answered. The patient agreed with the plan and demonstrated an understanding of the instructions.   The patient was advised to call back or seek an in-person evaluation if the symptoms worsen or if the condition fails to improve as anticipated.  I provided 15 minutes of non-face-to-face time during this encounter.   [name], NP   [facility] MD/PA/NP OP Progress Note  06/30/2020 11:11 AM [name]  MRN:  [id number]  Evaluation: Stacy Randall was seen.  WebEx.  She presents with a pleasant and bright affect today.  Citing " I had a good weekend" stated that she enjoys being around her friends.  Rates her depression 2 out of 10 with 10 being the worst.  Denying suicidal or homicidal ideations.  Denies auditory or visual hallucinations.  Patient continues to decline medication management at this time.  States she is utilizing coping skills such as " watching the stars from her window" reports a good appetite.  States she is resting well throughout the night.  Patient to continue with partial hospitalization programming.  Santanna reported mild anxiety when thinking about returning back to work.  Patient has declined to transition to intensive outpatient programming a this time.  Patient to keep follow-up appointment with individual therapy after completion of partial hospitalization programming.  Support, encouragement and  reassurance was provided.  Visit Diagnosis:    ICD-10-CM   1. Severe episode of recurrent major depressive disorder, without psychotic features (HCC)  F33.2   2. GAD (generalized anxiety disorder)  F41.1     Past Psychiatric History:   Past Medical History: No past medical history on file. No past surgical history on file.  Family Psychiatric History:   Family History: No family history on file.  Social History:  Social History   Socioeconomic History  . Marital status: Single    Spouse name: Not on file  . Number of children: 0  . Years of education: Not on file  . Highest education level: Some college, no degree  Occupational History  . Occupation: CNA  Tobacco Use  . Smoking status: Never Smoker  . Smokeless tobacco: Never Used  Vaping Use  . Vaping Use: Never used  Substance and Sexual Activity  . Alcohol use: No  . Drug use: Never  . Sexual activity: Never    Birth control/protection: Abstinence  Other Topics Concern  . Not on file  Social History Narrative   CNA through [employer]   Social Determinants of Health   Financial Resource Strain:   . Difficulty of Paying Living Expenses: Not on file  Food Insecurity:   . Worried About Programme researcher, broadcasting/film/video in the Last Year: Not on file  . Ran Out of Food in the Last Year: Not on file  Transportation Needs:   . Lack of Transportation (Medical): Not on file  . Lack of Transportation (Non-Medical): Not on file  Physical Activity:   . Days of Exercise per  Week: Not on file  . Minutes of Exercise per Session: Not on file  Stress:   . Feeling of Stress : Not on file  Social Connections:   . Frequency of Communication with Friends and Family: Not on file  . Frequency of Social Gatherings with Friends and Family: Not on file  . Attends Religious Services: Not on file  . Active Member of Clubs or Organizations: Not on file  . Attends Banker Meetings: Not on file  . Marital Status: Not on file     Allergies: No Known Allergies  Metabolic Disorder Labs: No results found for: HGBA1C, MPG No results found for: PROLACTIN No results found for: CHOL, TRIG, HDL, CHOLHDL, VLDL, LDLCALC No results found for: TSH  Therapeutic Level Labs: No results found for: LITHIUM No results found for: VALPROATE No components found for:  CBMZ  Current Medications: Current Outpatient Medications  Medication Sig Dispense Refill  . fluconazole (DIFLUCAN) 150 MG tablet Take 1 tablet (150 mg total) by mouth once. May repeat in 3 days if symptoms do not resolve (Patient not taking: Reported on 06/17/2020) 2 tablet 0  . lamoTRIgine (LAMICTAL) 25 MG tablet TAKE 1 TABLET BY MOUTH EVERYDAY AT BEDTIME (Patient not taking: Reported on 06/17/2020) 90 tablet 1  . sertraline (ZOLOFT) 100 MG tablet Take 1 tablet (100 mg total) by mouth daily. 30 tablet 1   No current facility-administered medications for this visit.     Musculoskeletal:   Psychiatric Specialty Exam: Review of Systems  There were no vitals taken for this visit.There is no height or weight on file to calculate BMI.  General Appearance: Casual  Eye Contact:  Good  Speech:  Clear and Coherent  Volume:  Normal  Mood:  Anxious and Depressed  Affect:  Congruent  Thought Process:  Coherent  Orientation:  Full (Time, Place, and Person)  Thought Content: Hallucinations: None   Suicidal Thoughts:  No  Homicidal Thoughts:  No  Memory:  Immediate;   Fair Recent;   Fair  Judgement:  Fair  Insight:  Fair  Psychomotor Activity:  Normal  Concentration:  Concentration: Fair  Recall:  [employer] of Knowledge: Fair  Language: Fair  Akathisia:  No  Handed:  Right  AIMS (if indicated):   Assets:  Communication Skills Desire for Improvement Social Support  ADL's:  Intact  Cognition: WNL  Sleep:  Fair   Screenings: PHQ2-9     Counselor from 06/17/2020 in [facility] PARTIAL HOSPITALIZATION PROGRAM  PHQ-2 Total Score 3  PHQ-9 Total  Score 13       Assessment and Plan:  Continue partial hospitalization programming Keep follow-up with individual therapy at discharge   Treatment plan was reviewed and agreed upon by NP T. Naszir Cott and patient [name] need for continued to report services.                  [name], NP 06/24/2020, 11:11 AM

## 2020-06-25 ENCOUNTER — Other Ambulatory Visit (HOSPITAL_COMMUNITY): Payer: BC Managed Care – PPO | Admitting: Licensed Clinical Social Worker

## 2020-06-25 ENCOUNTER — Encounter (HOSPITAL_COMMUNITY): Payer: Self-pay

## 2020-06-25 ENCOUNTER — Other Ambulatory Visit: Payer: Self-pay

## 2020-06-25 ENCOUNTER — Other Ambulatory Visit (HOSPITAL_COMMUNITY): Payer: BC Managed Care – PPO | Admitting: Occupational Therapy

## 2020-06-25 DIAGNOSIS — F332 Major depressive disorder, recurrent severe without psychotic features: Secondary | ICD-10-CM

## 2020-06-25 DIAGNOSIS — R4589 Other symptoms and signs involving emotional state: Secondary | ICD-10-CM

## 2020-06-25 DIAGNOSIS — R41844 Frontal lobe and executive function deficit: Secondary | ICD-10-CM

## 2020-06-25 DIAGNOSIS — F411 Generalized anxiety disorder: Secondary | ICD-10-CM

## 2020-06-25 NOTE — Progress Notes (Signed)
Spoke with patient via Webex video call, used 2 identifiers to correctly identify patient.States that groups are going well, she does worry about going back to work this weekend. Expecting to be discharged this Friday and will discuss her options regaurding therapy. Has been waking in the middle of the night for the past few days. Takes no medications. Will be going to visit family out of state next week and is looking forward to it. On scale 1-10 as 10 being worst she rates depression at 2 and anxiety at 4. Denies SI/HI or A/V hallucinations. PHQ9=7. No issues or complaints.

## 2020-06-25 NOTE — Therapy (Signed)
[facility] PARTIAL HOSPITALIZATION PROGRAM [address] [place], [place], [address] Phone: [phone]   Fax:  [phone] Virtual Visit via Video Note  I connected with Stacy Randall on 06/25/20 at  12:00 PM EST by a video enabled telemedicine application and verified that I am speaking with the correct person using two identifiers.  Location: Patient: Patient Home Provider: Clinic Office    I discussed the limitations of evaluation and management by telemedicine and the availability of in person appointments. The patient expressed understanding and agreed to proceed.    I discussed the assessment and treatment plan with the patient. The patient was provided an opportunity to ask questions and all were answered. The patient agreed with the plan and demonstrated an understanding of the instructions.   The patient was advised to call back or seek an in-person evaluation if the symptoms worsen or if the condition fails to improve as anticipated.  I provided 60 minutes of non-face-to-face time during this encounter.   Stacy Randall, OT   Occupational Therapy Treatment  Patient Details  Name: Stacy Randall Date of Birth: Nov 21, 1995 Referring Provider (OT): Hillery Jacks   Encounter Date: 06/25/2020   OT End of Session - 06/25/20 1426    Visit Number 9    Number of Visits 20    Date for OT Re-Evaluation 07/11/20    Authorization Type BCBS - No co-pay  No visit limit  No auth req    OT Start Time 1200    OT Stop Time 1250    OT Time Calculation (min) 50 min    Activity Tolerance Patient tolerated treatment well    Behavior During Therapy [facility] for tasks assessed/performed           History reviewed. No pertinent past medical history.  History reviewed. No pertinent surgical history.  There were no vitals filed for this visit.   Subjective Assessment - 06/25/20 1426    Currently in Pain? No/denies           OT  Education - 06/25/20 1426    Education Details Educated on the 5 F's and provided resources/strategies/tips to improve overall health and wellness    Person(s) Educated Patient    Methods Explanation;Handout    Comprehension Verbalized understanding            OT Short Term Goals - 06/14/20 1223      OT SHORT TERM GOAL #1   Title Pt will actively engage in OT group sessions throughout duration of PHP programming, in order to promote daily structure, social engagement, and opportunities to develop and utilize adaptive strategies to maximize functional performance in preparation for safe transition and integration back into school, work, and the community.    Status On-going      OT SHORT TERM GOAL #2   Title Pt will practice and identify 1-3 adaptive coping strategies she can utilize, in order to safely manage increased depression/anxiety, with min cues, in preparation for safe and healthy reintegration back into the community at discharge.    Status On-going      OT SHORT TERM GOAL #3   Title Pt will identify 1-3 stress management strategies she can utilize, in order to safely manage increased psychosocial stressors identified, with min cues, in preparation for safe transition back to the community at discharge.    Status On-going         Group Session:  S: "I only eat one meal a day  right now, typically dinner. I snack a little throughout the day."  O: Today's group session focused on the topic of health and wellness as it relates to the impact on mental health. Discussion focused on identifying the 5 F's to wellness including Food, Fitness, Fresh air, Fellowship, and Friendship with self and soul. Group members identified areas of wellness that they would like to improve upon and were educated and offered various resources. Discussion also focused on how the food we eat impacts our mental health, along with the benefits of engaging in physical activity/exercise and getting outside for  fresh air. Discussion wrapped up with group members identifying one area of wellness they could improve upon and identified a strategy to do so.    A: Stacy Randall was active in her participation of discussion and activity, sharing that she has difficulty with her appetite, and currently only eats about one meal a day. She shared that she does not know how to cook and often relies on her parents for dinner and eats snacks throughout the day. She appeared receptive to strategies offered, including incorporating healthier choices and thinking about a meal replacement or protein shake. Pt also expressed interest in getting gym membership or finding somewhere that offers yoga classes a couple times a week.   P: Continue to attend PHP OT group sessions 5x week for the remainder of the week to promote daily structure, social engagement, and opportunities to develop and utilize adaptive strategies to maximize functional performance in preparation for safe transition and integration back into school, work, and the community. Plan to address topic of sleep hygiene in next OT group session.    Plan - 06/25/20 1426    Occupational performance deficits (Please refer to evaluation for details): ADL's;IADL's;Rest and Sleep;Work;Education;Leisure;Social Participation    Body Structure / Function / Physical Skills ADL    Cognitive Skills Attention;Consciousness;Emotional;Energy/Drive;Memory;Perception;Problem Solve;Safety Awareness;Temperament/Personality;Thought;Understand    Psychosocial Skills Coping Strategies;Environmental  Adaptations;Habits;Interpersonal Interaction;Routines and Behaviors           Patient will benefit from skilled therapeutic intervention in order to improve the following deficits and impairments:   Body Structure / Function / Physical Skills: ADL Cognitive Skills: Attention, Consciousness, Emotional, Energy/Drive, Memory, Perception, Problem Solve, Safety Awareness, Temperament/Personality,  Thought, Understand Psychosocial Skills: Coping Strategies, Environmental  Adaptations, Habits, Interpersonal Interaction, Routines and Behaviors   Visit Diagnosis: Difficulty coping  Frontal lobe and executive function deficit  Severe episode of recurrent major depressive disorder, without psychotic features (HCC)    Problem List Patient Active Problem List   Diagnosis Date Noted  . Premenstrual dysphoric disorder 02/07/2020    06/25/2020  Stacy Randall, MOT, OTR/L  06/25/2020, 2:27 PM  [facility] HOSPITALIZATION PROGRAM [address] [place], [place], [address] Phone: [phone]   Fax:  [phone]  Name: Stacy Randall Date of Birth: February 20, 1996

## 2020-06-26 ENCOUNTER — Other Ambulatory Visit (HOSPITAL_COMMUNITY): Payer: BC Managed Care – PPO | Admitting: Licensed Clinical Social Worker

## 2020-06-26 ENCOUNTER — Other Ambulatory Visit: Payer: Self-pay

## 2020-06-26 ENCOUNTER — Other Ambulatory Visit (HOSPITAL_COMMUNITY): Payer: BC Managed Care – PPO | Admitting: Occupational Therapy

## 2020-06-26 ENCOUNTER — Encounter (HOSPITAL_COMMUNITY): Payer: Self-pay

## 2020-06-26 DIAGNOSIS — F332 Major depressive disorder, recurrent severe without psychotic features: Secondary | ICD-10-CM | POA: Diagnosis not present

## 2020-06-26 DIAGNOSIS — R4589 Other symptoms and signs involving emotional state: Secondary | ICD-10-CM

## 2020-06-26 DIAGNOSIS — R41844 Frontal lobe and executive function deficit: Secondary | ICD-10-CM

## 2020-06-26 DIAGNOSIS — F411 Generalized anxiety disorder: Secondary | ICD-10-CM

## 2020-06-26 NOTE — Therapy (Signed)
[facility] PARTIAL HOSPITALIZATION PROGRAM [address] [place], [place], [address] Phone: [phone]   Fax:  [phone] Virtual Visit via Video Note  I connected with Stacy Randall on 06/26/20 at  12:00 PM EST by a video enabled telemedicine application and verified that I am speaking with the correct person using two identifiers.  Location: Patient: Patient Home Provider: Clinic Office   I discussed the limitations of evaluation and management by telemedicine and the availability of in person appointments. The patient expressed understanding and agreed to proceed.  I discussed the assessment and treatment plan with the patient. The patient was provided an opportunity to ask questions and all were answered. The patient agreed with the plan and demonstrated an understanding of the instructions.   The patient was advised to call back or seek an in-person evaluation if the symptoms worsen or if the condition fails to improve as anticipated.  I provided 50 minutes of non-face-to-face time during this encounter.   Donne Hazel, OT   Occupational Therapy Treatment  Patient Details  Name: Stacy Randall MRN: [id number] Date of Birth: 24-Jul-1996 Referring Provider (OT): Hillery Jacks   Encounter Date: 06/26/2020   OT End of Session - 06/26/20 1410    Visit Number 10    Number of Visits 20    Date for OT Re-Evaluation 07/11/20    Authorization Type BCBS - No co-pay  No visit limit  No auth req    OT Start Time 1200    OT Stop Time 1250    OT Time Calculation (min) 50 min    Activity Tolerance Patient tolerated treatment well    Behavior During Therapy [facility] for tasks assessed/performed           History reviewed. No pertinent past medical history.  History reviewed. No pertinent surgical history.  There were no vitals filed for this visit.   Subjective Assessment - 06/26/20 1410    Currently in Pain? No/denies             OT  Education - 06/26/20 1410    Education Details Educated on sleep hygiene and strategies to improve overall sleep quality    Person(s) Educated Patient    Methods Explanation;Handout    Comprehension Verbalized understanding            OT Short Term Goals - 06/14/20 1223      OT SHORT TERM GOAL #1   Title Pt will actively engage in OT group sessions throughout duration of PHP programming, in order to promote daily structure, social engagement, and opportunities to develop and utilize adaptive strategies to maximize functional performance in preparation for safe transition and integration back into school, work, and the community.    Status On-going      OT SHORT TERM GOAL #2   Title Pt will practice and identify 1-3 adaptive coping strategies she can utilize, in order to safely manage increased depression/anxiety, with min cues, in preparation for safe and healthy reintegration back into the community at discharge.    Status On-going      OT SHORT TERM GOAL #3   Title Pt will identify 1-3 stress management strategies she can utilize, in order to safely manage increased psychosocial stressors identified, with min cues, in preparation for safe transition back to the community at discharge.    Status On-going          Group Session:  S: "I think I get more than enough sleep which is  good with me."  O: Todays group discussion focused on topic of Sleep Hygiene. Patients reflected on the quality of sleep they typically receive and identified areas that need improvement. Group was given background information on sleep and sleep hygiene, including common sleep disorders. Group members also received information on how to improve ones sleep and introduced a sleep diary as a tool that can be utilized to track sleep quality over a length of time. Group session ended with patients identifying one or more strategies they could utilize or implement into their sleep routine in order to improve  overall sleep quality.    A: Stacy Randall appeared more withdrawn and depressed than noted in previous sessions, appeared less engaged, though responded when directly prompted. She shared that she has been getting "more than enough sleep" and reports no concerns. She did note that she was not aware that eating large meals can affect your sleep pattern, however again, noted no concerns. She was mostly attentive to education and discussion, though remained quiet and more withdrawn than previous sessions this week.   P: Continue to attend PHP OT group sessions 5x week for the remainder of the week to promote daily structure, social engagement, and opportunities to develop and utilize adaptive strategies to maximize functional performance in preparation for safe transition and integration back into school, work, and the community.    Plan - 06/26/20 1413    Occupational performance deficits (Please refer to evaluation for details): ADL's;IADL's;Rest and Sleep;Work;Education;Leisure;Social Participation    Body Structure / Function / Physical Skills ADL    Cognitive Skills Attention;Consciousness;Emotional;Energy/Drive;Memory;Perception;Problem Solve;Safety Awareness;Temperament/Personality;Thought;Understand    Psychosocial Skills Coping Strategies;Environmental  Adaptations;Habits;Interpersonal Interaction;Routines and Behaviors           Patient will benefit from skilled therapeutic intervention in order to improve the following deficits and impairments:   Body Structure / Function / Physical Skills: ADL Cognitive Skills: Attention, Consciousness, Emotional, Energy/Drive, Memory, Perception, Problem Solve, Safety Awareness, Temperament/Personality, Thought, Understand Psychosocial Skills: Coping Strategies, Environmental  Adaptations, Habits, Interpersonal Interaction, Routines and Behaviors   Visit Diagnosis: Difficulty coping  Frontal lobe and executive function deficit  Severe episode of  recurrent major depressive disorder, without psychotic features [facility])    Problem List Patient Active Problem List   Diagnosis Date Noted   Premenstrual dysphoric disorder 02/07/2020    06/26/2020  Donne Hazel, MOT, OTR/L 06/26/2020, 2:14 PM  [facility] PARTIAL HOSPITALIZATION PROGRAM [address] [place], [place], [address] Phone: [phone]   Fax:  [phone]  Name: Sabina Beavers MRN: [id number] Date of Birth: 13-Apr-1996

## 2020-06-26 NOTE — Progress Notes (Signed)
Spiritual care group   06/26/2020 11:00-12:00  Group met via web-ex due to COVID-19 precautions.  Group facilitated by Simone Curia, MDiv, BCC   Group focused on topic of "self-care"  Patients engaged in facilitated discussion about topic.  Explored quotes related to self care and chose one which they agreed with and one which they disliked.  Engaged in discussion around quote choices and their experience / understanding of care for themselves.    Stacy Randall was present throughout group.  Engaged with topic, identifying the quote "being extremely honest with oneself is a good exercise."  Reflected on tendency to avoid things, and working on "non-avoidance" as self-care.  Reflected with other group members about self care as practice and journey and it is not always something that "feels good," but rather something that honors self.

## 2020-06-27 ENCOUNTER — Other Ambulatory Visit: Payer: Self-pay

## 2020-06-27 ENCOUNTER — Encounter (HOSPITAL_COMMUNITY): Payer: Self-pay

## 2020-06-27 ENCOUNTER — Other Ambulatory Visit (HOSPITAL_COMMUNITY): Payer: BC Managed Care – PPO | Admitting: Licensed Clinical Social Worker

## 2020-06-27 ENCOUNTER — Other Ambulatory Visit (HOSPITAL_COMMUNITY): Payer: BC Managed Care – PPO | Admitting: Occupational Therapy

## 2020-06-27 DIAGNOSIS — R41844 Frontal lobe and executive function deficit: Secondary | ICD-10-CM

## 2020-06-27 DIAGNOSIS — F411 Generalized anxiety disorder: Secondary | ICD-10-CM

## 2020-06-27 DIAGNOSIS — R4589 Other symptoms and signs involving emotional state: Secondary | ICD-10-CM

## 2020-06-27 DIAGNOSIS — F332 Major depressive disorder, recurrent severe without psychotic features: Secondary | ICD-10-CM | POA: Diagnosis not present

## 2020-06-27 NOTE — Therapy (Signed)
[facility] PARTIAL HOSPITALIZATION PROGRAM [address] [place], [place], [address] Phone: [phone]   Fax:  [phone] Virtual Visit via Video Note  I connected with Stacy Randall on 06/27/20 at  10:50 AM EST by a video enabled telemedicine application and verified that I am speaking with the correct person using two identifiers.  Location: Patient: Patient Home Provider: Clinic Office    I discussed the limitations of evaluation and management by telemedicine and the availability of in person appointments. The patient expressed understanding and agreed to proceed.  I discussed the assessment and treatment plan with the patient. The patient was provided an opportunity to ask questions and all were answered. The patient agreed with the plan and demonstrated an understanding of the instructions.   The patient was advised to call back or seek an in-person evaluation if the symptoms worsen or if the condition fails to improve as anticipated.  I provided 60 minutes of non-face-to-face time during this encounter.   Donne Hazel, OT   Occupational Therapy Treatment  Patient Details  Name: Stacy Randall MRN: [id number] Date of Birth: 1995-10-11 Referring Provider (OT): Hillery Jacks   Encounter Date: 06/27/2020   OT End of Session - 06/27/20 1302    Visit Number 11    Number of Visits 20    Date for OT Re-Evaluation 07/11/20    Authorization Type BCBS - No co-pay  No visit limit  No auth req    OT Start Time 1050    OT Stop Time 1150    OT Time Calculation (min) 60 min    Activity Tolerance Patient tolerated treatment well    Behavior During Therapy [facility] for tasks assessed/performed;Flat affect           History reviewed. No pertinent past medical history.  History reviewed. No pertinent surgical history.  There were no vitals filed for this visit.   Subjective Assessment - 06/27/20 1302    Currently in Pain? No/denies             OT Education - 06/27/20 1302    Education Details Educated on different factors that contribute to our ability to manage our time, along with specific time management tips/strategies, including good sleep hygiene practices/habits    Person(s) Educated Patient    Methods Explanation;Handout    Comprehension Verbalized understanding            OT Short Term Goals - 06/14/20 1223      OT SHORT TERM GOAL #1   Title Pt will actively engage in OT group sessions throughout duration of PHP programming, in order to promote daily structure, social engagement, and opportunities to develop and utilize adaptive strategies to maximize functional performance in preparation for safe transition and integration back into school, work, and the community.    Status On-going      OT SHORT TERM GOAL #2   Title Pt will practice and identify 1-3 adaptive coping strategies she can utilize, in order to safely manage increased depression/anxiety, with min cues, in preparation for safe and healthy reintegration back into the community at discharge.    Status On-going      OT SHORT TERM GOAL #3   Title Pt will identify 1-3 stress management strategies she can utilize, in order to safely manage increased psychosocial stressors identified, with min cues, in preparation for safe transition back to the community at discharge.    Status On-going         Group  Session:  S: None noted*  O: Group began with a reflection from previous OT session focused on sleep hygiene and members shared any new strategies or routines implemented since last session. Today's group focused on topic of Time Management. Group members identified ways in which they currently struggle with managing their time and discussion focused on alternative strategies to recognize how we can be more productive and intentional with our time and managing it appropriately. Discussion also tied in sleep hygiene tips and strategies, as group members  identified lack of sleep/energy as a driving force that contributes to their overall ability to manage their time and productivity.    A: Stacy Randall was active in her participation of discussion and activity, sharing that she gave herself a facial last night and felt more relaxed before going to sleep last night. She appeared somewhat engaged in education received during today's session focused on time management, however was largely withdrawn, quiet, and appeared disinterested at times. Pt denied any concerns, though was again, limited in her engagement. Pt did not identify any time management strategies to focus on, noting she does "well" in this area.   P: Continue to attend PHP OT group sessions 5x week for the remainder of the week to promote daily structure, social engagement, and opportunities to develop and utilize adaptive strategies to maximize functional performance in preparation for safe transition and integration back into school, work, and the community. Plan to address topic of time management in next OT group session.   Plan - 06/27/20 1304    Occupational performance deficits (Please refer to evaluation for details): ADL's;IADL's;Rest and Sleep;Work;Education;Leisure;Social Participation    Body Structure / Function / Physical Skills ADL    Cognitive Skills Attention;Consciousness;Emotional;Energy/Drive;Memory;Perception;Problem Solve;Safety Awareness;Temperament/Personality;Thought;Understand    Psychosocial Skills Coping Strategies;Environmental  Adaptations;Habits;Interpersonal Interaction;Routines and Behaviors           Patient will benefit from skilled therapeutic intervention in order to improve the following deficits and impairments:   Body Structure / Function / Physical Skills: ADL Cognitive Skills: Attention, Consciousness, Emotional, Energy/Drive, Memory, Perception, Problem Solve, Safety Awareness, Temperament/Personality, Thought, Understand Psychosocial Skills: Coping  Strategies, Environmental  Adaptations, Habits, Interpersonal Interaction, Routines and Behaviors   Visit Diagnosis: Difficulty coping  Frontal lobe and executive function deficit  Severe episode of recurrent major depressive disorder, without psychotic features ([facility])    Problem List Patient Active Problem List   Diagnosis Date Noted  . Premenstrual dysphoric disorder 02/07/2020    06/27/2020  Donne Hazel, MOT, OTR/L  06/27/2020, 1:05 PM  [facility] HOSPITALIZATION PROGRAM [address] [place], [place], [address] Phone: [phone]   Fax:  [phone]  Name: Stacy Randall MRN: [id number] Date of Birth: [birth year]

## 2020-06-28 ENCOUNTER — Encounter (HOSPITAL_COMMUNITY): Payer: Self-pay

## 2020-06-28 ENCOUNTER — Other Ambulatory Visit (HOSPITAL_COMMUNITY): Payer: BC Managed Care – PPO | Admitting: Licensed Clinical Social Worker

## 2020-06-28 ENCOUNTER — Other Ambulatory Visit (HOSPITAL_COMMUNITY): Payer: BC Managed Care – PPO | Admitting: Occupational Therapy

## 2020-06-28 ENCOUNTER — Other Ambulatory Visit: Payer: Self-pay

## 2020-06-28 DIAGNOSIS — F411 Generalized anxiety disorder: Secondary | ICD-10-CM

## 2020-06-28 DIAGNOSIS — R4589 Other symptoms and signs involving emotional state: Secondary | ICD-10-CM

## 2020-06-28 DIAGNOSIS — F332 Major depressive disorder, recurrent severe without psychotic features: Secondary | ICD-10-CM

## 2020-06-28 DIAGNOSIS — R41844 Frontal lobe and executive function deficit: Secondary | ICD-10-CM

## 2020-06-28 NOTE — Therapy (Signed)
[place], [place], [address] Phone: [phone]   Fax:  [phone] Virtual Visit via Video Note  I connected with [name] on 06/28/20 at  11:00 AM EST by a video enabled telemedicine application and verified that I am speaking with the correct person using two identifiers.  Location: Patient: Patient Home Provider: Clinic Office    I discussed the limitations of evaluation and management by telemedicine and the availability of in person appointments. The patient expressed understanding and agreed to proceed.  I discussed the assessment and treatment plan with the patient. The patient was provided an opportunity to ask questions and all were answered. The patient agreed with the plan and demonstrated an understanding of the instructions.   The patient was advised to call back or seek an in-person evaluation if the symptoms worsen or if the condition fails to improve as anticipated.  I provided 60 minutes of non-face-to-face time during this encounter.   Ponciano Ort, OT   Occupational Therapy Treatment  Patient Details  Name: Stacy Randall MRN: [id number] Date of Birth: 1996/07/10 Referring Provider (OT): Ricky Ala   Encounter Date: 06/28/2020   OT End of Session - 06/28/20 1426    Visit Number 12    Number of Visits 20    Date for OT Re-Evaluation 07/11/20    Authorization Type [employer]BS - No co-pay  No visit limit  No auth req    OT Start Time 1100    OT Stop Time 1200    OT Time Calculation (min) 60 min    Activity Tolerance Patient tolerated treatment well    Behavior During Therapy [facility] for tasks assessed/performed;Flat affect           History reviewed. No pertinent past medical history.  History reviewed. No pertinent surgical history.  There were no vitals filed for this visit.   Subjective Assessment - 06/28/20 1425    Currently in Pain? No/denies            OT Education - 06/28/20 1425    Education Details Continued education on different factors that contribute to our ability to manage our time, along with specific time management tips/strategies, including procrastination    Person(s) Educated Patient    Methods Explanation;Handout    Comprehension Verbalized understanding            OT Short Term Goals - 06/28/20 1426      OT SHORT TERM GOAL #1   Title Pt will actively engage in OT group sessions throughout duration of PHP programming, in order to promote daily structure, social engagement, and opportunities to develop and utilize adaptive strategies to maximize functional performance in preparation for safe transition and integration back into school, work, and the community.    Status Achieved      OT SHORT TERM GOAL #2   Title Pt will practice and identify 1-3 adaptive coping strategies she can utilize, in order to safely manage increased depression/anxiety, with min cues, in preparation for safe and healthy reintegration back into the community at discharge.    Status Achieved      OT SHORT TERM GOAL #3   Title Pt will identify 1-3 stress management strategies she can utilize, in order to safely manage increased psychosocial stressors identified, with min cues, in preparation for safe transition back to the community at discharge.    Status Achieved         Group Session:  S:  None noted*  O: Group began with a recap on strategies/tips learned from previous OT session focused on time management. Today's group continued to focus on topic of time management and reviewed additional strategies to manage and create schedules that are more efficient and effective in meeting their needs. Group members also discussed specific strategies to overcome procrastination.   A: Farron was minimally engaged in her participation of discussion during today's session. Pt identified feeling "tired" and appeared more withdrawn and flat than  previous sessions earlier this week. Pt denied any concerns and was largely observant and attentive to discussion and peers' input. Pt did not identify any specific time management or procrastination strategies she finds most effective, however noted that she likes to do all of her tasks at once in a chunk to get them over with.   P: Plan to discharge patient today, effective 06/28/20, with plan to follow up with individual therapist and PCP for on-going therapy and treatment needs.  OCCUPATIONAL THERAPY DISCHARGE SUMMARY  Visits from Start of Care: 12  Current functional level related to goals / functional outcomes: Patient has achieved all of her OT goals within the [facility] program and will discharge today, effective 06/28/20, with plan to follow up with PCP and individual therapist for on-going mental health needs and treatment.    Remaining deficits: See above    Education / Equipment: See above   Plan: Patient agrees to discharge.  Patient goals were met. Patient is being discharged due to meeting the stated rehab goals.  ?????       Plan - 06/28/20 1426    Occupational performance deficits (Please refer to evaluation for details): ADL's;IADL's;Rest and Sleep;Work;Education;Leisure;Social Participation    Body Structure / Function / Physical Skills ADL    Cognitive Skills Attention;Consciousness;Emotional;Energy/Drive;Memory;Perception;Problem Solve;Safety Awareness;Temperament/Personality;Thought;Understand    Psychosocial Skills Coping Strategies;Environmental  Adaptations;Habits;Interpersonal Interaction;Routines and Behaviors           Patient will benefit from skilled therapeutic intervention in order to improve the following deficits and impairments:   Body Structure / Function / Physical Skills: ADL Cognitive Skills: Attention, Consciousness, Emotional, Energy/Drive, Memory, Perception, Problem Solve, Safety Awareness, Temperament/Personality, Thought,  Understand Psychosocial Skills: Coping Strategies, Environmental  Adaptations, Habits, Interpersonal Interaction, Routines and Behaviors   Visit Diagnosis: Difficulty coping  Frontal lobe and executive function deficit  Severe episode of recurrent major depressive disorder, without psychotic features ([place])    Problem List Patient Active Problem List   Diagnosis Date Noted  . Premenstrual dysphoric disorder 02/07/2020    06/28/2020  Ponciano Ort, MOT, OTR/L  06/28/2020, 2:27 PM  [place], [place], [address] Phone: [phone]   Fax:  [phone]  Name: Norissa Bartee MRN: [id number] Date of Birth: [birth year]

## 2020-06-28 NOTE — Progress Notes (Signed)
Virtual Visit via Video Note  I connected with Stacy Randall on 06/28/20 at  9:00 AM EST by a video enabled telemedicine application and verified that I am speaking with the correct person using two identifiers.  Location: Patient: home Provider: home   I discussed the limitations of evaluation and management by telemedicine and the availability of in person appointments. The patient expressed understanding and agreed to proceed.   I discussed the assessment and treatment plan with the patient. The patient was provided an opportunity to ask questions and all were answered. The patient agreed with the plan and demonstrated an understanding of the instructions.   The patient was advised to call back or seek an in-person evaluation if the symptoms worsen or if the condition fails to improve as anticipated.  I provided 15 minutes of non-face-to-face time during this encounter.   Stacy Rack, NP   [facility] Partial Hosptilzation Outpatient Program Discharge Summary  Stacy Randall [id number]  Admission date: 06/15/2020 Discharge date: 06/28/2020  Reason for admission: Per admission assessment note: Stacy Randall is a 24 y.o. Caucasian female presents with depressed mood. She reported" I have struggle with depression and anxiety since about age 4."  States she was followed by primary care when her mood swings and depression got worse. Denied that her mood is seasonal or cyclic.  She stated that her emotions is triggered by her monthly menstrual cycles.  Reports elevated moods and low moods that come and go.  No identifiable triggers.  Denies suicidal attempts. denied family history with mental illness. She denied previous inpatient admissions.  Denied a history of physical or sexual abuse.  Denied illicit drug use and/or alcohol abuse.   Progress in Program Toward Treatment Goals: Ongoing, Stacy Randall attended and participated with daily group sessions with active and  engaged participation.  Denying suicidal or homicidal ideations at discharge.  States her depression has improved since attending partial hospitalization program.  Although she continues to struggle with symptoms states she is going to utilize coping skills such as " positive affirmation."  Reports she is going to continue with therapy after discharge declined to restart any medications at this time.  Support, encouragement and reassurance was provided.  Progress (rationale): Keep follow-up appointment with primary psychiatrist Dr. Hinton Randall and therapist, follow-up for TSH, T3/T4   Take all medications as prescribed. Keep all follow-up appointments as scheduled.  Do not consume alcohol or use illegal drugs while on prescription medications. Report any adverse effects from your medications to your primary care provider promptly.  In the event of recurrent symptoms or worsening symptoms, call 911, a crisis hotline, or go to the nearest emergency department for evaluation.    Stacy Rack, NP 06/28/2020

## 2020-06-29 ENCOUNTER — Encounter (HOSPITAL_COMMUNITY): Payer: Self-pay | Admitting: Family

## 2020-06-30 ENCOUNTER — Encounter (HOSPITAL_COMMUNITY): Payer: Self-pay | Admitting: Family

## 2020-07-01 NOTE — Psych (Signed)
Virtual Visit via Video Note  I connected with Stacy Randall on 06/14/20 at  9:00 AM EDT by a video enabled telemedicine application and verified that I am speaking with the correct person using two identifiers.  Location: Patient: Patient home Provider: Clinical home office   I discussed the limitations of evaluation and management by telemedicine and the availability of in person appointments. The patient expressed understanding and agreed to proceed.  I discussed the assessment and treatment plan with the patient. The patient was provided an opportunity to ask questions and all were answered. The patient agreed with the plan and demonstrated an understanding of the instructions.   The patient was advised to call back or seek an in-person evaluation if the symptoms worsen or if the condition fails to improve as anticipated.  Pt was provided 240 minutes of non-face-to-face time during this encounter.   Stacy Randall    [facility] PHP THERAPIST PROGRESS NOTE  Stacy Randall [id number]  Session Time: 9:00 - 10:00  Participation Level: Active  Behavioral Response: CasualAlertDepressed  Type of Therapy: Group Therapy  Treatment Goals addressed: Coping  Interventions: CBT, DBT, Supportive and Reframing  Summary: Clinician led check-in regarding current stressors and situation, and review of patient completed daily inventory. Clinician utilized active listening and empathetic response and validated patient emotions. Clinician facilitated processing group on pertinent issues.   Therapist Response: Stacy Randall is a 24 y.o. female who presents with depression symptoms. Patient arrived within time allowed and reports that she is feeling "really tired." Patient rates hermood at a6 on a scale of 1-10 with 10 being great. Pt reports she unpacked her room and "laid around." Pt states experiencing passive SI in the evening and attempting distractions to manage. Pt able to  process. Pt engaged in discussion.     Session Time: 10:00 - 11:00   Participation Level:Active  Behavioral Response:CasualAlertDepressed  Type of Therapy:GroupTherapy  Treatment Goals addressed: Coping  Interventions:CBT, DBT, Supportive and Reframing  Summary: [name] led discussion on impulsivity. Group discussed struggles with impulsivity. [name] highlighted theme of immediacy. Group built insight around the way immediacy interatcs with impulsivity. [name] encouraged pt's to consider mantras and grounding statements to remind themselves there is time to think/feel/act.   Therapist Response:Pt engaged in discussion and is able to make connections and gain insight.      Session Time: 11:00- 12:00  Participation Level:Active  Behavioral Response:CasualAlertDepressed  Type of Therapy: Group Therapy, OT  Treatment Goals addressed: Coping  Interventions:Psychosocial skills training, Supportive  Summary:Occupational Therapy group  Therapist Response:Patient engaged in group. See OT note.      Session Time: 12:00 -1:00  Participation Level:Active  Behavioral Response:CasualAlertDepressed  Type of Therapy:Group therapy  Treatment Goals addressed: Coping  Interventions:CBT; Solution focused; Supportive; Reframing  Summary:12:00 - 12:50:[name] introduced topic of positive psychology.  Group viewed TED talk "The Happiness Advantage" to aid discussion. Group discussed the 5 ways to train your brain to scan for the positive: conscious acts of kindness, meditation, exercise, postive event journaling, and gratitudes. Group members discussed how to apply the principles in their every day life.  12:50 -1:00 Clinician led check-out. Clinician assessed for immediate needs, medication compliance and efficacy, and safety concerns  Therapist Response:12:00 - 12:50:Pt engaged in discussion and reports she can start by utilizing  gratitudes.  12:50 - 1:00: At check-out, patientrates hermood at [facility] on a scale of 1-10 with 10 being great.Pt reports afternoon plans ofseeing a friend. Pt demonstrates some progress as evidenced by  increased motivation to utilize skills. Patient denies SI/HI/self-harm at the end of group.    Suicidal/Homicidal: Nowithout intent/plan  Plan: Pt will continue in PHP while working to decrease depression and anxiety symptoms, increase ability to manage symptoms, in a healthy manner, and eliminate SI.   Diagnosis: Severe episode of recurrent major depressive disorder, without psychotic features (HCC) [F33.2]    1. Severe episode of recurrent major depressive disorder, without psychotic features (HCC)       Donia Guiles, LCSW 07/01/2020

## 2020-07-02 NOTE — Psych (Signed)
Virtual Visit via Video Note  I connected with Stacy Randall on 06/25/20 at  9:00 AM EST by a video enabled telemedicine application and verified that I am speaking with the correct person using two identifiers.  Location: Patient: Patient home Provider: Clinical home office   I discussed the limitations of evaluation and management by telemedicine and the availability of in person appointments. The patient expressed understanding and agreed to proceed.  I discussed the assessment and treatment plan with the patient. The patient was provided an opportunity to ask questions and all were answered. The patient agreed with the plan and demonstrated an understanding of the instructions.   The patient was advised to call back or seek an in-person evaluation if the symptoms worsen or if the condition fails to improve as anticipated.  Pt was provided 240 minutes of non-face-to-face time during this encounter.   Stacy Randall    [facility] PHP THERAPIST PROGRESS NOTE  Stacy Randall [id number]  Session Time: 9:00 - 10:00  Participation Level: Active  Behavioral Response: CasualAlertDepressed  Type of Therapy: Group Therapy  Treatment Goals addressed: Coping  Interventions: CBT, DBT, Supportive and Reframing  Summary: Clinician led check-in regarding current stressors and situation, and review of patient completed daily inventory. Clinician utilized active listening and empathetic response and validated patient emotions. Clinician facilitated processing group on pertinent issues.   Therapist Response: Stacy Randall is a 24 y.o. female who presents with depression symptoms. Patient arrived within time allowed and reports that she is feeling "pretty good." Patient rates hermood at [facility] on a scale of 1-10 with 10 being great. Pt reports she had a "chill" day yesterday and slept well. Pt reports stress regarding returning to work. Pt able to process. Pt engaged in  discussion.      Session Time: 10:00 - 11:00   Participation Level:Active  Behavioral Response:CasualAlertDepressed  Type of Therapy:GroupTherapy  Treatment Goals addressed: Coping  Interventions:CBT, DBT, Supportive and Reframing  Summary: [name] led discussion on forgiveness. Group members shared ways in which they struggle with forgiveness and how it has hurt them. [name] provided space for group to process. [name] encouraged pt's to consider forgiveness as a journey to free themselves from something holding them back.   Therapist Response:Pt engaged in discussion and is able to process.      Session Time: 11:00- 12:00  Participation Level:Active  Behavioral Response:CasualAlertDepressed  Type of Therapy: Group Therapy, OT  Treatment Goals addressed: Coping  Interventions:Psychosocial skills training, Supportive  Summary:Occupational Therapy group  Therapist Response:Patient engaged in group. See OT note.      Session Time: 12:00 -1:00  Participation Level:Active  Behavioral Response:CasualAlertDepressed  Type of Therapy:Group therapy  Treatment Goals addressed: Coping  Interventions:CBT; Solution focused; Supportive; Reframing  Summary:12:00 - 12:50:[name] continued topic of boundaries. [name] discussed the different ways boundaries present: physical, emotional, intellectual, sexual, material, and time. Group talked about the ways in which each type presents for them and is a struggle.  12:50 -1:00 Clinician led check-out. Clinician assessed for immediate needs, medication compliance and efficacy, and safety concerns  Therapist Response:12:00 - 12:50:Pt engaged in discussion and reports most issue with emotional boundaries.  12:50 - 1:00: At check-out, patientrates hermood at [facility] on a scale of 1-10 with 10 being great.Pt reports afternoon plans ofdashing and laundry. Pt demonstrates some progress as evidenced  by increase in prosocial activities. Patient denies SI/HI/self-harm at the end of group.    Suicidal/Homicidal: Nowithout intent/plan  Plan: Pt will continue in PHP  while working to decrease depression and anxiety symptoms, increase ability to manage symptoms, in a healthy manner, and eliminate SI.   Diagnosis: Severe episode of recurrent major depressive disorder, without psychotic features (HCC) [F33.2]    1. Severe episode of recurrent major depressive disorder, without psychotic features (HCC)   2. GAD (generalized anxiety disorder)       Stacy Guiles, LCSW 07/02/2020

## 2020-07-02 NOTE — Psych (Signed)
Virtual Visit via Video Note  I connected with Stacy Randall on 06/18/20 at  9:00 AM EST by a video enabled telemedicine application and verified that I am speaking with the correct person using two identifiers.  Location: Patient: Patient home Provider: Clinical home office   I discussed the limitations of evaluation and management by telemedicine and the availability of in person appointments. The patient expressed understanding and agreed to proceed.  I discussed the assessment and treatment plan with the patient. The patient was provided an opportunity to ask questions and all were answered. The patient agreed with the plan and demonstrated an understanding of the instructions.   The patient was advised to call back or seek an in-person evaluation if the symptoms worsen or if the condition fails to improve as anticipated.  Pt was provided 240 minutes of non-face-to-face time during this encounter.   [name]    [facility] PHP THERAPIST PROGRESS NOTE  Stacy Randall [id number]  Session Time: 9:00 - 10:00  Participation Level: Active  Behavioral Response: CasualAlertDepressed  Type of Therapy: Group Therapy  Treatment Goals addressed: Coping  Interventions: CBT, DBT, Supportive and Reframing  Summary: Clinician led check-in regarding current stressors and situation, and review of patient completed daily inventory. Clinician utilized active listening and empathetic response and validated patient emotions. Clinician facilitated processing group on pertinent issues.   Therapist Response: Raima Geathers is a 24 y.o. female who presents with depression symptoms. Patient arrived within time allowed and reports that she is feeling "scrambled." Patient rates hermood at [facility] on a scale of 1-10 with 10 being great. Pt reports she woke up late and her morning as rushed. Pt states she struggled with loneliness last night and experienced passive SI. Pt reports managing by  going to sleep. Pt able to process. Pt engaged in discussion.      Session Time: 10:00 - 11:00   Participation Level:Active  Behavioral Response:CasualAlertDepressed  Type of Therapy:GroupTherapy  Treatment Goals addressed: Coping  Interventions:CBT, DBT, Supportive and Reframing  Summary: [name] led discussion on the ways our phones can cause barriers to our mental health. Group discussed struggles they have with their phones and the role it can play in unhealthy thought patterns and behaviors. Group worked to problem solve ways to cut down on negative behaviors.   Therapist Response:Pt engaged in discussion and was able to process.      Session Time: 11:00- 12:00  Participation Level:Active  Behavioral Response:CasualAlertDepressed  Type of Therapy: Group Therapy, OT  Treatment Goals addressed: Coping  Interventions:Psychosocial skills training, Supportive  Summary:Occupational Therapy group  Therapist Response:Patient engaged in group. See OT note.      Session Time: 12:00 -1:00  Participation Level:Active  Behavioral Response:CasualAlertDepressed  Type of Therapy:Group therapy  Treatment Goals addressed: Coping  Interventions:CBT; Solution focused; Supportive; Reframing  Summary:12:00 - 12:50:[name] continued topic of healthy relationships. [name] discussed the way in which self-esteem can affect what we accept in relationships. [name] emphasized focusing on actions rather than words with those we are in relationship with. Group members shared ways in which their self-esteem has affected relationship dynamics.  12:50 -1:00 Clinician led check-out. Clinician assessed for immediate needs, medication compliance and efficacy, and safety concerns  Therapist Response:12:00 - 12:50:Pt engaged in discussion and is able to process and gain insight.   12:50 - 1:00: At check-out, patientrates hermood at [facility] on a scale  of 1-10 with 10 being great.Pt reports afternoon plans ofnapping and cleaning. Pt demonstrates some progress as  evidenced by managing SI in a healthy manner. Patient denies SI/HI/self-harm at the end of group.    Suicidal/Homicidal: Nowithout intent/plan  Plan: Pt will continue in PHP while working to decrease depression and anxiety symptoms, increase ability to manage symptoms, in a healthy manner, and eliminate SI.   Diagnosis: Severe episode of recurrent major depressive disorder, without psychotic features (HCC) [F33.2]    1. Severe episode of recurrent major depressive disorder, without psychotic features (HCC)   2. GAD (generalized anxiety disorder)       Donia Guiles, LCSW 07/02/2020

## 2020-07-02 NOTE — Psych (Signed)
Virtual Visit via Video Note  I connected with Stacy Randall on 06/17/20 at  9:00 AM EST by a video enabled telemedicine application and verified that I am speaking with the correct person using two identifiers.  Location: Patient: Patient home Provider: Clinical home office   I discussed the limitations of evaluation and management by telemedicine and the availability of in person appointments. The patient expressed understanding and agreed to proceed.  I discussed the assessment and treatment plan with the patient. The patient was provided an opportunity to ask questions and all were answered. The patient agreed with the plan and demonstrated an understanding of the instructions.   The patient was advised to call back or seek an in-person evaluation if the symptoms worsen or if the condition fails to improve as anticipated.  Pt was provided 240 minutes of non-face-to-face time during this encounter.   [name]    [facility] PHP THERAPIST PROGRESS NOTE  Iysis Germain [id number]  Session Time: 9:00 - 10:00  Participation Level: Active  Behavioral Response: CasualAlertDepressed  Type of Therapy: Group Therapy  Treatment Goals addressed: Coping  Interventions: CBT, DBT, Supportive and Reframing  Summary: Clinician led check-in regarding current stressors and situation, and review of patient completed daily inventory. Clinician utilized active listening and empathetic response and validated patient emotions. Clinician facilitated processing group on pertinent issues.   Therapist Response: Hayzel Ruberg is a 24 y.o. female who presents with depression symptoms. Patient arrived within time allowed and reports that she is feeling "pretty good." Patient rates hermood at [facility] on a scale of 1-10 with 10 being great. Pt reports she spent the weekend with friends and it improved her mood. Pt states experiencing some mood dips due to negative thinking and her friends  helped distract her. Pt struggles with mood dependent thinking. Pt able to process. Pt engaged in discussion.     Session Time: 10:00 - 11:00   Participation Level:Active  Behavioral Response:CasualAlertDepressed  Type of Therapy:GroupTherapy  Treatment Goals addressed: Coping  Interventions:CBT, DBT, Supportive and Reframing  Summary: [name] led discussion on ways to reinforce new habits. Group shared ways in which they can set themselves up for good habits. [name] encouraged reminders in phone, post-it notes, educating support system, and practicing in low-stress environments.   Therapist Response:Pt engaged in discussion and is able to determine ways to reinforce positive habits.     Session Time: 11:00- 12:00  Participation Level:Active  Behavioral Response:CasualAlertDepressed  Type of Therapy: Group Therapy, OT  Treatment Goals addressed: Coping  Interventions:Psychosocial skills training, Supportive  Summary:Occupational Therapy group  Therapist Response:Patient engaged in group. See OT note.      Session Time: 12:00 -1:00  Participation Level:Active  Behavioral Response:CasualAlertDepressed  Type of Therapy:Group therapy  Treatment Goals addressed: Coping  Interventions:CBT; Solution focused; Supportive; Reframing  Summary:12:00 - 12:50:[name] led discussion on healthy relationships. Group members shared issues they have experienced in past relationships and in identifying what "healthy" looks like. [name] discussed respect, trust, and honesty as non-negotiable traits in a healthy dynamic. Group shared and problem solved barriers to recognizing and priortizing these traits.  12:50 -1:00 Clinician led check-out. Clinician assessed for immediate needs, medication compliance and efficacy, and safety concerns  Therapist Response:12:00 - 12:50:Pt engaged in discussion and is able to process.   12:50 - 1:00: At  check-out, patientrates hermood at [facility] on a scale of 1-10 with 10 being great.Pt reports afternoon plans ofgetting out of the house. Pt demonstrates some progress as evidenced  by increased animation in facial expression. Patient denies SI/HI/self-harm at the end of group.    Suicidal/Homicidal: Nowithout intent/plan  Plan: Pt will continue in PHP while working to decrease depression and anxiety symptoms, increase ability to manage symptoms, in a healthy manner, and eliminate SI.   Diagnosis: Severe episode of recurrent major depressive disorder, without psychotic features (HCC) [F33.2]    1. Severe episode of recurrent major depressive disorder, without psychotic features (HCC)   2. GAD (generalized anxiety disorder)       Donia Guiles, LCSW 07/02/2020

## 2020-07-02 NOTE — Psych (Signed)
Virtual Visit via Video Note  I connected with Stacy Randall on 06/26/20 at  9:00 AM EST by a video enabled telemedicine application and verified that I am speaking with the correct person using two identifiers.  Location: Patient: Patient home Provider: Clinical home office   I discussed the limitations of evaluation and management by telemedicine and the availability of in person appointments. The patient expressed understanding and agreed to proceed.  I discussed the assessment and treatment plan with the patient. The patient was provided an opportunity to ask questions and all were answered. The patient agreed with the plan and demonstrated an understanding of the instructions.   The patient was advised to call back or seek an in-person evaluation if the symptoms worsen or if the condition fails to improve as anticipated.  Pt was provided 240 minutes of non-face-to-face time during this encounter.   Stacy Randall    [facility] PHP THERAPIST PROGRESS NOTE  Stacy Randall [id number]  Session Time: 9:00 - 10:00  Participation Level: Active  Behavioral Response: CasualAlertDepressed  Type of Therapy: Group Therapy  Treatment Goals addressed: Coping  Interventions: CBT, DBT, Supportive and Reframing  Summary: Clinician led check-in regarding current stressors and situation, and review of patient completed daily inventory. Clinician utilized active listening and empathetic response and validated patient emotions. Clinician facilitated processing group on pertinent issues.   Therapist Response: Stacy Randall is a 24 y.o. female who presents with depression symptoms. Patient arrived within time allowed and reports that she is feeling "good." Patient rates hermood at [facility] on a scale of 1-10 with 10 being great. Pt reports she saw a friend and got some work done yesterday. Pt reports improving sleep. Pt able to process. Pt engaged in discussion.      Session  Time: 10:00 - 11:00   Participation Level:Active  Behavioral Response:CasualAlertDepressed  Type of Therapy:GroupTherapy  Treatment Goals addressed: Coping  Interventions:CBT, DBT, Supportive and Reframing  Summary: [name] facilitated processing group around difficult relationships. Group members shared struggles they face in relationships. [name] brought in topics of self-esteem, CBT thought challenging, boundaries, and communication to aid growth.   Therapist Response:Pt engaged in discussion and is able to process.       Session Time: 11:00- 12:00  Participation Level:Active  Behavioral Response:CasualAlertDepressed  Type of Therapy:Group therapy  Treatment Goals addressed: Coping  Interventions:Supportive, Reframing  Summary:Spiritual Care group  Therapist Response:Pt engaged in session. See chaplain note      Session Time: 12:00 -1:00  Participation Level:Active  Behavioral Response:CasualAlertDepressed  Type of Therapy:Group therapy  Treatment Goals addressed: Coping  Interventions:Psychosocial skills training, Supportive  Summary:12:00 - 12:50:Occupational Therapy group 12:50 -1:00 Clinician led check-out. Clinician assessed for immediate needs, medication compliance and efficacy, and safety concerns  Therapist Response:12:00 - 12:50:Patient engaged in group. See OT note.   12:50 - 1:00: At check-out, patientrates hermood at [facility] on a scale of 1-10 with 10 being great.Pt reports afternoon plans ofwatching a movie and dashing. Pt demonstrates some progress as evidenced by increased ability to manage emotions. Patient denies SI/HI/self-harm at the end of group.    Suicidal/Homicidal: Nowithout intent/plan  Plan: Pt will continue in PHP while working to decrease depression and anxiety symptoms, increase ability to manage symptoms, in a healthy manner, and eliminate SI.   Diagnosis: Severe episode of  recurrent major depressive disorder, without psychotic features (HCC) [F33.2]    1. Severe episode of recurrent major depressive disorder, without psychotic features (HCC)   2. GAD (generalized  anxiety disorder)       Stacy Guiles, LCSW 07/02/2020

## 2020-07-02 NOTE — Psych (Signed)
Virtual Visit via Video Note  I connected with Stacy Randall on 06/19/20 at  9:00 AM EST by a video enabled telemedicine application and verified that I am speaking with the correct person using two identifiers.  Location: Patient: Patient home Provider: Clinical home office   I discussed the limitations of evaluation and management by telemedicine and the availability of in person appointments. The patient expressed understanding and agreed to proceed.  I discussed the assessment and treatment plan with the patient. The patient was provided an opportunity to ask questions and all were answered. The patient agreed with the plan and demonstrated an understanding of the instructions.   The patient was advised to call back or seek an in-person evaluation if the symptoms worsen or if the condition fails to improve as anticipated.  Pt was provided 240 minutes of non-face-to-face time during this encounter.   Stacy Randall    [facility] PHP THERAPIST PROGRESS NOTE  Stacy Randall [id number]  Session Time: 9:00 - 10:00  Participation Level: Active  Behavioral Response: CasualAlertDepressed  Type of Therapy: Group Therapy  Treatment Goals addressed: Coping  Interventions: CBT, DBT, Supportive and Reframing  Summary: Clinician led check-in regarding current stressors and situation, and review of patient completed daily inventory. Clinician utilized active listening and empathetic response and validated patient emotions. Clinician facilitated processing group on pertinent issues.   Therapist Response: Stacy Randall is a 24 y.o. female who presents with depression symptoms. Patient arrived within time allowed and reports that she is feeling "tired." Patient rates hermood at [facility] on a scale of 1-10 with 10 being great. Pt reports she reached out to her support system and played games with her sister and talked to the phone with a friend. Pt reports struggling with sleep last  night. Pt able to process. Pt engaged in discussion.      Session Time: 10:00 - 11:00   Participation Level:Active  Behavioral Response:CasualAlertDepressed  Type of Therapy:GroupTherapy  Treatment Goals addressed: Coping  Interventions:CBT, DBT, Supportive and Reframing  Summary: [name] led discussion on coping with loneliness. Group members shared common reactions to loneliness and how it helps/hinders them. [name] contextualized loneliness and encouraged pt's to manage loneliness as they would any other feeling. Group brainstormed ways to manage.   Therapist Response:Pt engaged in discussion and reports increased insight.      Session Time: 11:00- 12:00  Participation Level:Active  Behavioral Response:CasualAlertDepressed  Type of Therapy: Group Therapy, OT  Treatment Goals addressed: Coping  Interventions:Psychosocial skills training, Supportive  Summary:Occupational Therapy group  Therapist Response:Patient engaged in group. See OT note.      Session Time: 12:00 -1:00  Participation Level:Active  Behavioral Response:CasualAlertDepressed  Type of Therapy:Group therapy  Treatment Goals addressed: Coping  Interventions:CBT; Solution focused; Supportive; Reframing  Summary:12:00 - 12:50:[name] introduced topic of boundaries. [name] discussed how boundaries inform our relationships and affect self-esteem and personal agency. Group discussed the three types of boundaries: rigid, porous, and healthy and when each type is most helpful/harmful.  12:50 -1:00 Clinician led check-out. Clinician assessed for immediate needs, medication compliance and efficacy, and safety concerns  Therapist Response:12:00 - 12:50: Pt engaged in discussion and reports having both porous and rigid boundaries.  12:50 - 1:00: At check-out, patientrates hermood at [facility] on a scale of 1-10 with 10 being great.Pt reports afternoon plans  ofspending time with her dog. Pt demonstrates some progress as evidenced by intentionally reaching out to support system. Patient denies SI/HI/self-harm at the end of group.  Suicidal/Homicidal: Nowithout intent/plan  Plan: Pt will continue in PHP while working to decrease depression and anxiety symptoms, increase ability to manage symptoms, in a healthy manner, and eliminate SI.   Diagnosis: Severe episode of recurrent major depressive disorder, without psychotic features (HCC) [F33.2]    1. Severe episode of recurrent major depressive disorder, without psychotic features (HCC)   2. GAD (generalized anxiety disorder)       Stacy Guiles, LCSW 07/02/2020

## 2020-07-03 NOTE — Psych (Signed)
Virtual Visit via Video Note  I connected with Stacy Randall on 06/27/20 at  9:00 AM EST by a video enabled telemedicine application and verified that I am speaking with the correct person using two identifiers.  Location: Patient: Patient home Provider: Clinical home office   I discussed the limitations of evaluation and management by telemedicine and the availability of in person appointments. The patient expressed understanding and agreed to proceed.  I discussed the assessment and treatment plan with the patient. The patient was provided an opportunity to ask questions and all were answered. The patient agreed with the plan and demonstrated an understanding of the instructions.   The patient was advised to call back or seek an in-person evaluation if the symptoms worsen or if the condition fails to improve as anticipated.  Pt was provided 240 minutes of non-face-to-face time during this encounter.   [name]    [facility] PHP THERAPIST PROGRESS NOTE  Stacy Randall [id number]  Session Time: 9:00 - 10:00  Participation Level: Active  Behavioral Response: CasualAlertDepressed  Type of Therapy: Group Therapy  Treatment Goals addressed: Coping  Interventions: CBT, DBT, Supportive and Reframing  Summary: Clinician led check-in regarding current stressors and situation, and review of patient completed daily inventory. Clinician utilized active listening and empathetic response and validated patient emotions. Clinician facilitated processing group on pertinent issues.   Therapist Response: Stacy Randall is a 24 y.o. female who presents with depression symptoms. Patient arrived within time allowed and reports that she is feeling "good." Patient rates hermood at [facility] on a scale of 1-10 with 10 being great. Pt reports she worked to stay busy yesterday and is increasing ease in filling her time. Pt able to process. Pt engaged in discussion.      Session  Time: 10:00 - 11:00   Participation Level:Active  Behavioral Response:CasualAlertDepressed  Type of Therapy:GroupTherapy  Treatment Goals addressed: Coping  Interventions:CBT, DBT, Supportive and Reframing  Summary: [name] led discussion on communicating our struggles with our support team. [name] introduced the concept of giving disclosures to the people close to [place] regarding the way we act in specific situations or the way we process certain stimuli. Group members discussed ways to provide this "road map to our brain" and in what situations this may be helpful to them   Therapist Response:Pt engaged in discussion and is able to determine situations in which providing a disclosure can be helpful and practiced doing so.      Session Time: 11:00- 12:00  Participation Level:Active  Behavioral Response:CasualAlertDepressed  Type of Therapy: Group Therapy, OT  Treatment Goals addressed: Coping  Interventions:Psychosocial skills training, Supportive  Summary:Occupational Therapy group  Therapist Response:Patient engaged in group. See OT note.      Session Time: 12:00 -1:00  Participation Level:Active  Behavioral Response:CasualAlertDepressed  Type of Therapy:Group therapy  Treatment Goals addressed: Coping  Interventions:CBT; Solution focused; Supportive; Reframing  Summary:12:00 - 12:50:[name] introduced topic of CBT cognitive distortions. [name] discussed unhealthy thought patterns and how our thoughts shape our reality and irrational thoughts can alter our perspective. [name] utilized handout "cognitive distortions" to discuss common examples of distorted thoughts and group members worked to identify examples in their own life.  12:50 -1:00 Clinician led check-out. Clinician assessed for immediate needs, medication compliance and efficacy, and safety concerns  Therapist Response:12:00 - 12:50: Pt enaged in discussion and is able  to determine examples of distorted thinking in her own life.  12:50 - 1:00: At check-out, patientrates hermood at [facility]  on a scale of 1-10 with 10 being great.Pt reports afternoon plans ofdashing and taking a shower. Pt demonstrates some progress as evidenced by increase in self-reliance. Patient denies SI/HI/self-harm at the end of group.    Suicidal/Homicidal: Nowithout intent/plan  Plan: Pt will continue in PHP while working to decrease depression and anxiety symptoms, increase ability to manage symptoms, in a healthy manner, and eliminate SI.   Diagnosis: Severe episode of recurrent major depressive disorder, without psychotic features (HCC) [F33.2]    1. Severe episode of recurrent major depressive disorder, without psychotic features (HCC)   2. GAD (generalized anxiety disorder)       Donia Guiles, LCSW 07/03/2020

## 2020-07-03 NOTE — Psych (Signed)
Virtual Visit via Video Note  I connected with Stacy Randall on 06/28/20 at  9:00 AM EST by a video enabled telemedicine application and verified that I am speaking with the correct person using two identifiers.  Location: Patient: Patient home Provider: Clinical home office   I discussed the limitations of evaluation and management by telemedicine and the availability of in person appointments. The patient expressed understanding and agreed to proceed.  I discussed the assessment and treatment plan with the patient. The patient was provided an opportunity to ask questions and all were answered. The patient agreed with the plan and demonstrated an understanding of the instructions.   The patient was advised to call back or seek an in-person evaluation if the symptoms worsen or if the condition fails to improve as anticipated.  Pt was provided 240 minutes of non-face-to-face time during this encounter.   Stacy Randall    [facility] PHP THERAPIST PROGRESS NOTE  [name] [id number]  Session Time: 9:00 - 10:00  Participation Level: Active  Behavioral Response: CasualAlertDepressed  Type of Therapy: Group Therapy  Treatment Goals addressed: Coping  Interventions: CBT, DBT, Supportive and Reframing  Summary: Clinician led check-in regarding current stressors and situation, and review of patient completed daily inventory. Clinician utilized active listening and empathetic response and validated patient emotions. Clinician facilitated processing group on pertinent issues.   Therapist Response: Stacy Randall is a 24 y.o. female who presents with depression symptoms. Patient arrived within time allowed and reports that she is feeling "good." Patient rates hermood at [facility] on a scale of 1-10 with 10 being great. Pt reports she spent the day doing self care activities and connected with friends. Pt able to process. Pt engaged in discussion.      Session Time:  10:00 - 11:00   Participation Level:Active  Behavioral Response:CasualAlertDepressed  Type of Therapy:GroupTherapy  Treatment Goals addressed: Coping  Interventions:CBT, DBT, Supportive and Reframing  Summary: [name] continued topic of CBT cognitive distortions and utilized handout "Unhealthy Thought Patterns"to review common examples of distorted thought to increase awareness of the distorted thoughts.  Therapist Response:Pt engaged in discussion and is able to make connections from her life to the distorted thoughts.       Session Time: 11:00- 12:00  Participation Level:Active  Behavioral Response:CasualAlertDepressed  Type of Therapy: Group Therapy, OT  Treatment Goals addressed: Coping  Interventions:Psychosocial skills training, Supportive  Summary:Occupational Therapy group  Therapist Response:Patient engaged in group. See OT note.      Session Time: 12:00 -1:00  Participation Level:Active  Behavioral Response:CasualAlertDepressed  Type of Therapy:Group therapy  Treatment Goals addressed: Coping  Interventions:CBT; Solution focused; Supportive; Reframing  Summary:12:00 - 12:50:[name] continued topic of CBT cognitive distortions and introduced thought challenging as a way to  battle distorted thinking. Group utilized Administrator, [employer] questions" as a way to introduce challenges and reframe distorted thinking. Group members worked through pt examples to practice challenging distorted thinking.  12:50 -1:00 Clinician led check-out. Clinician assessed for immediate needs, medication compliance and efficacy, and safety concerns  Therapist Response:12:00 - 12:50: Pt engaged in discussion and demonstrates understanding of challenging distorted thoughts through practice.  12:50 - 1:00: At check-out, patientrates hermood at [facility] on a scale of 1-10 with 10 being great.Pt reports afternoon plans ofseeing a friend. Pt  demonstrates some progress as evidenced by increased utilization of support. Patient denies SI/HI/self-harm at the end of group.    Suicidal/Homicidal: Nowithout intent/plan  Plan: Pt will discharge from PHP due to meeting  treatment goals of decreased depression and anxiety symptoms, increased ability to manage symptoms, in a healthy manner, and eliminated SI. Pt has declined IOP and outpatient follow-up. [name] highly encouraged therapy and provided referrals for individual therapy should pt change her mind. Pt and provider are aligned with discharge. Pt denies SI/HI at time of discharge.   Diagnosis: Severe episode of recurrent major depressive disorder, without psychotic features (HCC) [F33.2]    1. Severe episode of recurrent major depressive disorder, without psychotic features (HCC)   2. GAD (generalized anxiety disorder)       Stacy Guiles, LCSW 07/03/2020

## 2020-11-04 ENCOUNTER — Telehealth (INDEPENDENT_AMBULATORY_CARE_PROVIDER_SITE_OTHER): Payer: BC Managed Care – PPO | Admitting: Psychiatry

## 2020-11-04 ENCOUNTER — Other Ambulatory Visit: Payer: Self-pay

## 2020-11-04 DIAGNOSIS — F331 Major depressive disorder, recurrent, moderate: Secondary | ICD-10-CM

## 2020-11-04 DIAGNOSIS — F3281 Premenstrual dysphoric disorder: Secondary | ICD-10-CM | POA: Diagnosis not present

## 2020-11-04 MED ORDER — HYDROXYZINE HCL 50 MG PO TABS: 50.0000 mg | ORAL_TABLET | Freq: Every evening | ORAL | 0 refills | Status: AC | PRN

## 2020-11-04 MED ORDER — SERTRALINE HCL 50 MG PO TABS: 50.0000 mg | ORAL_TABLET | Freq: Every day | ORAL | 1 refills | Status: DC

## 2020-11-04 NOTE — Progress Notes (Signed)
BH MD/PA/NP OP Progress Note  11/04/2020 8:36 AM Stacy Randall  MRN:  [id number] Interview was conducted using videoconferencing application and I verified that I was speaking with the correct person using two identifiers. I discussed the limitations of evaluation and management by telemedicine and  the availability of in person appointments. Patient expressed understanding and agreed to proceed. Participants in the visit: patient (location - home); physician (location - home office).  Chief Complaint: Depressed mood.  HPI: 25 yo SWF who has been treated by her PCP since October 2020 for  episodic depression/anxiety. She has been having emotional recurrent issues since age of 25. She has been feeling down, hopeless, at times passively suicidal for a day then next day will feel normal. She would occasionally worry excessively.  She reports that her mood fluctuations occur for several days prior to her menses and resolve once period begins. She has been treated with Lexapro then Zoloft but has been off medications since October. In November last year her mood declined, started again to feel passively suicidal and started in [facility] (11[place]3-11/19/21). She declined an offer to restart on antipderessant; she also did not return to see me until today. Over past few weeks she again started to feel depressed, sleep de teriorated, appetite declined and concentration suffered. She finally acknowledged that she is having recurrent problems which do not respond to just "string will" and she needs help. She denies having SI; she has never attempted suicide. She reports difficulty falling asleep. She has no hx of alcohol or drug abuse. There is no family hx of any psychiatric, alcohol or drug abuse issues. Medical hx was reviewed and is noncontributory (no chronic meand decline in appetite. She denies having manic/hypomani episodes, psychotic symdical problems, no hx of surgeries).              Visit Diagnosis:     ICD-10-CM   1. Moderate episode of recurrent major depressive disorder (HCC)  F33.1   2. Premenstrual dysphoric disorder  F32.81     Past Psychiatric History: Please see intake H&P.  Past Medical History: No past medical history on file. No past surgical history on file.  Family Psychiatric History: None  Family History: No family history on file.  Social History:  Social History   Socioeconomic History  . Marital status: Single    Spouse name: Not on file  . Number of children: 0  . Years of education: Not on file  . Highest education level: Some college, no degree  Occupational History  . Occupation: CNA  Tobacco Use  . Smoking status: Never Smoker  . Smokeless tobacco: Never Used  Vaping Use  . Vaping Use: Never used  Substance and Sexual Activity  . Alcohol use: No  . Drug use: Never  . Sexual activity: Never    Birth control/protection: Abstinence  Other Topics Concern  . Not on file  Social History Narrative   CNA through [employer]   Social Determinants of Health   Financial Resource Strain: Not on file  Food Insecurity: Not on file  Transportation Needs: Not on file  Physical Activity: Not on file  Stress: Not on file  Social Connections: Not on file    Allergies: No Known Allergies  Metabolic Disorder Labs: No results found for: HGBA1C, MPG No results found for: PROLACTIN No results found for: CHOL, TRIG, HDL, CHOLHDL, VLDL, LDLCALC No results found for: TSH  Therapeutic Level Labs: No results found for: LITHIUM No results found  for: VALPROATE No components found for:  CBMZ  Current Medications: Current Outpatient Medications  Medication Sig Dispense Refill  . hydrOXYzine (ATARAX/VISTARIL) 50 MG tablet Take 1 tablet (50 mg total) by mouth at bedtime as needed (sleep). 30 tablet 0  . fluconazole (DIFLUCAN) 150 MG tablet Take 1 tablet (150 mg total) by mouth once. May repeat in 3 days if symptoms do not resolve (Patient not taking:  Reported on 06/17/2020) 2 tablet 0  . sertraline (ZOLOFT) 50 MG tablet Take 1 tablet (50 mg total) by mouth daily. 30 tablet 1   No current facility-administered medications for this visit.     Psychiatric Specialty Exam: Review of Systems  Constitutional: Positive for appetite change.  Psychiatric/Behavioral: Positive for decreased concentration and sleep disturbance. The patient is nervous/anxious.   All other systems reviewed and are negative.   There were no vitals taken for this visit.There is no height or weight on file to calculate BMI.  General Appearance: Casual  Eye Contact:  Fair  Speech:  Clear and Coherent and Normal Rate  Volume:  Normal  Mood:  Depressed  Affect:  Full Range  Thought Process:  Goal Directed  Orientation:  Full (Time, Place, and Person)  Thought Content: Logical   Suicidal Thoughts:  No  Homicidal Thoughts:  No  Memory:  Immediate;   Good Recent;   Good Remote;   Good  Judgement:  Good  Insight:  Fair  Psychomotor Activity:  Normal  Concentration:  Concentration: Fair  Recall:  Good  Fund of Knowledge: Good  Language: Good  Akathisia:  Negative  Handed:  Right  AIMS (if indicated): not done  Assets:  Communication Skills Desire for Improvement Financial Resources/Insurance Housing Physical Health Social Support Talents/Skills  ADL's:  Intact  Cognition: WNL  Sleep:  Fair   Screenings: PHQ2-9   Flowsheet Row Video Visit from 11/04/2020 in [facility] Counselor from 06/25/2020 in [facility] Counselor from 06/17/2020 in [facility]  PHQ-2 Total Score 3 3 3   PHQ-9 Total Score 10 7 13     Flowsheet Row Video Visit from 11/04/2020 in [facility]  C-SSRS RISK CATEGORY No Risk       Assessment and Plan: 25 yo SWF who has been treated by her PCP since October 2020 for  episodic  depression/anxiety. She has been having emotional recurrent issues since age of 54. She has been feeling down, hopeless, at times passively suicidal for a day then next day will feel normal. She would occasionally worry excessively.  She reports that her mood fluctuations occur for several days prior to her menses and resolve once period begins. She has been treated with Lexapro then Zoloft but has been off medications since October. In November last year her mood declined, started again to feel passively suicidal and started in [facility] (11/3-11/19/21). She declined an offer to restart on antipderessant; she also did not return to see me until today. Over past few weeks she again started to feel depressed, sleep de teriorated, appetite declined and concentration suffered. She finally acknowledged that she is having recurrent problems which do not respond to just "string will" and she needs help. She denies having SI; she has never attempted suicide. She reports difficulty falling asleep. She has no hx of alcohol or drug abuse. There is no family hx of any psychiatric, alcohol or drug abuse issues. Medical hx was reviewed and is noncontributory (no chronic  meand decline in appetite. She denies having manic/hypomani episodes, psychotic symdical problems, no hx of surgeries).            Dx: MDD recurrent, moderate; PMD  Plan: We will restart sertraline 50 mg and add hydroxyzine 50 mg prn insomnia. We may want to increase the dose if sertraline is only partially effective. Next appointment in 4 weeks with a new provider. The plan was discussed with patient and mother who had an opportunity to ask questions and these were all answered. I spend 25 min in video clinical contact with the patient.   Magdalene Patricia, MD 11/04/2020, 8:36 AM

## 2020-11-25 ENCOUNTER — Telehealth (INDEPENDENT_AMBULATORY_CARE_PROVIDER_SITE_OTHER): Payer: BC Managed Care – PPO | Admitting: Psychiatry

## 2020-11-25 ENCOUNTER — Other Ambulatory Visit: Payer: Self-pay

## 2020-11-25 DIAGNOSIS — F3341 Major depressive disorder, recurrent, in partial remission: Secondary | ICD-10-CM | POA: Diagnosis not present

## 2020-11-26 ENCOUNTER — Other Ambulatory Visit: Payer: Self-pay

## 2020-11-26 NOTE — Progress Notes (Signed)
BH MD/PA/NP OP Progress Note  11/25/2020  Stacy Randall  MRN:  [id number] Interview was conducted using telephone  application and I verified that I was speaking with the correct person using two identifiers. I discussed the limitations of evaluation and management by telemedicine and  the availability of in person appointments. Patient expressed understanding and agreed to proceed.  Duration- 20 minutes  Participants in the visit: patient (location - home); physician (location - clinic office).  Chief Complaint:  medication management appointment   HPI: 25 yo female, history of depression, anxiety, dating back to adolescence . She was being followed by Dr. Hinton Dyer, who has retired . I am providing outpatient psychiatric coverage pending new clinician starting .  Currently reports mood has improved partially and denies  worsening depression or anxiety. Denies suicidal ideations. No psychotic symptoms noted or reported  Denies medication side effects. Currently on Zoloft 50 mgr QDAY.                  Visit Diagnosis:  No diagnosis found.  Past Psychiatric History: Please see intake H&P.  Past Medical History: No past medical history on file. No past surgical history on file.  Family Psychiatric History: None  Family History: No family history on file.  Social History:  Social History   Socioeconomic History  . Marital status: Single    Spouse name: Not on file  . Number of children: 0  . Years of education: Not on file  . Highest education level: Some college, no degree  Occupational History  . Occupation: CNA  Tobacco Use  . Smoking status: Never Smoker  . Smokeless tobacco: Never Used  Vaping Use  . Vaping Use: Never used  Substance and Sexual Activity  . Alcohol use: No  . Drug use: Never  . Sexual activity: Never    Birth control/protection: Abstinence  Other Topics Concern  . Not on file  Social History Narrative   CNA through [employer]    Social Determinants of Health   Financial Resource Strain: Not on file  Food Insecurity: Not on file  Transportation Needs: Not on file  Physical Activity: Not on file  Stress: Not on file  Social Connections: Not on file    Allergies: No Known Allergies  Metabolic Disorder Labs: No results found for: HGBA1C, MPG No results found for: PROLACTIN No results found for: CHOL, TRIG, HDL, CHOLHDL, VLDL, LDLCALC No results found for: TSH  Therapeutic Level Labs: No results found for: LITHIUM No results found for: VALPROATE No components found for:  CBMZ  Current Medications: Current Outpatient Medications  Medication Sig Dispense Refill  . fluconazole (DIFLUCAN) 150 MG tablet Take 1 tablet (150 mg total) by mouth once. May repeat in 3 days if symptoms do not resolve (Patient not taking: Reported on 06/17/2020) 2 tablet 0  . hydrOXYzine (ATARAX/VISTARIL) 50 MG tablet Take 1 tablet (50 mg total) by mouth at bedtime as needed (sleep). 30 tablet 0  . sertraline (ZOLOFT) 50 MG tablet Take 1 tablet (50 mg total) by mouth daily. 30 tablet 1   No current facility-administered medications for this visit.     Psychiatric Specialty Exam: note inherent difficulties in obtaining full MSE in the context of phone communication Review of Systems  Constitutional: Positive for appetite change.  Psychiatric/Behavioral: Positive for decreased concentration and sleep disturbance. The patient is nervous/anxious.   All other systems reviewed and are negative.   There were no vitals taken for this visit.There is no height  or weight on file to calculate BMI.  General Appearance: NA  Eye Contact: NA   Speech:  Normal Rate  Volume:  Normal  Mood:  Reports partially improved mood   Affect:  Presents reactive   Thought Process:  Goal Directed  Orientation:  Full (Time, Place, and Person)  Thought Content: no hallucinations, no delusions expressed   Suicidal Thoughts:  No denies SI   Homicidal  Thoughts:  No  Memory:  Recent and remote grossly intact   Judgement:  Good  Insight:  Fair  Psychomotor Activity:  NA  Concentration:  Grossly intact   Recall:  Good  Fund of Knowledge: Good  Language: Good  Akathisia:  Negative  Handed:  Right  AIMS (if indicated): not done  Assets:  Communication Skills Desire for Improvement Financial Resources/Insurance Housing Physical Health Social Support Talents/Skills  ADL's:  Intact  Cognition: WNL  Sleep:  Fair   Screenings: PHQ2-9   Flowsheet Row Video Visit [facility] 11[facility]04[facility]2020 [facility] Counselor from 06/25/2020 in [facility] Counselor from 06/17/2020 in [facility]  PHQ-2 Total Score 3 3 3   PHQ-9 Total Score 10 7 13     Flowsheet Row Video Visit from 11/04/2020 in [facility]  C-SSRS RISK CATEGORY No Risk       Assessment and Plan: 25 yo female with history of depression. Has been diagnosed with MDD. Currently on Zoloft and Vistaril PRN for insomnia.Has tolerated well thus far . No side effects reported .  Side effects reviewed .              Dx- MDD, no psychotic features   Plan: Continue Zoloft 50 mgr QDAY for depression- does not currently require refills . Will see in 4 weeks, agrees to contact clinic sooner if any worsening prior or if any medication concerns    11/06/2020, MD 11/26/2020, 10:04 AM

## 2020-12-27 ENCOUNTER — Telehealth (HOSPITAL_COMMUNITY): Payer: Self-pay

## 2020-12-27 DIAGNOSIS — F3341 Major depressive disorder, recurrent, in partial remission: Secondary | ICD-10-CM

## 2020-12-27 MED ORDER — HYDROXYZINE HCL 50 MG PO TABS: 50.0000 mg | ORAL_TABLET | Freq: Every day | ORAL | 0 refills | Status: DC | PRN

## 2020-12-27 MED ORDER — SERTRALINE HCL 50 MG PO TABS
50.0000 mg | ORAL_TABLET | Freq: Every day | ORAL | 0 refills | Status: DC
Start: 2020-12-27 — End: 2021-01-07

## 2020-12-27 NOTE — Telephone Encounter (Signed)
Medication refill request - Patient's Mother called stating pt is in need of new medication orders for Zoloft and Hydroxyzine.  Requests refills be sent in as pt's next appt with Dr. Jama Flavors is not until 01/08/21. Contacted Kim at [facility] who verified pt's needed new orders as patient will run out of Zoloft prior to appointment and needs previous PRN Hydroxyzine refill for the 50 mg tablet, one at bedtime as needed, no current refills on file.

## 2020-12-27 NOTE — Telephone Encounter (Signed)
Thirty days bridge medication supply given.

## 2021-01-07 ENCOUNTER — Other Ambulatory Visit (HOSPITAL_COMMUNITY): Payer: Self-pay | Admitting: *Deleted

## 2021-01-07 DIAGNOSIS — F3341 Major depressive disorder, recurrent, in partial remission: Secondary | ICD-10-CM

## 2021-01-07 MED ORDER — SERTRALINE HCL 50 MG PO TABS: 50.0000 mg | ORAL_TABLET | Freq: Every day | ORAL | 0 refills | Status: DC

## 2021-01-08 ENCOUNTER — Telehealth (HOSPITAL_COMMUNITY): Payer: BC Managed Care – PPO | Admitting: Psychiatry

## 2021-01-20 ENCOUNTER — Telehealth (HOSPITAL_COMMUNITY): Payer: BC Managed Care – PPO | Admitting: Psychiatry

## 2021-01-22 ENCOUNTER — Other Ambulatory Visit (HOSPITAL_COMMUNITY): Payer: Self-pay | Admitting: Psychiatry

## 2021-01-22 DIAGNOSIS — F3341 Major depressive disorder, recurrent, in partial remission: Secondary | ICD-10-CM

## 2021-02-26 ENCOUNTER — Telehealth (HOSPITAL_COMMUNITY): Payer: Self-pay

## 2021-02-26 ENCOUNTER — Other Ambulatory Visit (HOSPITAL_COMMUNITY): Payer: Self-pay | Admitting: Psychiatry

## 2021-02-26 DIAGNOSIS — F3341 Major depressive disorder, recurrent, in partial remission: Secondary | ICD-10-CM

## 2021-02-26 MED ORDER — SERTRALINE HCL 50 MG PO TABS: 50.0000 mg | ORAL_TABLET | Freq: Every day | ORAL | 0 refills | Status: DC

## 2021-02-26 NOTE — Telephone Encounter (Addendum)
Patient called requesting a refill on her Sertraline 50mg  to be sent to [facility] on [address]/[place]. 1106 at front desk is going to call her so she can make an appointment. Patient has followup scheduled with you on 7/26.  This is Dr. 8/26 patient. Writer was instructed by Shawn to contact you to send in the refill if I haven't heard from Dr. Jama Flavors. I have not heard from him; therefore, could you send in the refill please. Thank you   NOTIFIED PATIENT

## 2021-02-26 NOTE — Telephone Encounter (Signed)
A thirty day supply given. Patient must keep appointment with his provider for future refills.

## 2021-02-26 NOTE — Telephone Encounter (Signed)
Patient has followup scheduled with you on 7/26

## 2021-03-04 ENCOUNTER — Encounter (HOSPITAL_COMMUNITY): Payer: Self-pay | Admitting: Psychiatry

## 2021-03-04 ENCOUNTER — Telehealth (INDEPENDENT_AMBULATORY_CARE_PROVIDER_SITE_OTHER): Payer: BC Managed Care – PPO | Admitting: Psychiatry

## 2021-03-04 ENCOUNTER — Other Ambulatory Visit: Payer: Self-pay

## 2021-03-04 DIAGNOSIS — F3341 Major depressive disorder, recurrent, in partial remission: Secondary | ICD-10-CM | POA: Diagnosis not present

## 2021-03-04 MED ORDER — SERTRALINE HCL 50 MG PO TABS: 50.0000 mg | ORAL_TABLET | Freq: Every day | ORAL | 0 refills | Status: DC

## 2021-03-04 MED ORDER — SERTRALINE HCL 50 MG PO TABS: 50.0000 mg | ORAL_TABLET | Freq: Every day | ORAL | 2 refills | Status: DC

## 2021-03-04 NOTE — Progress Notes (Signed)
BH MD/PA/NP OP Progress Note  11/25/2020  Stacy Randall  MRN:  [id number] Interview was conducted using telephone  application and I verified that I was speaking with the correct person using two identifiers. I discussed the limitations of evaluation and management by telemedicine and  the availability of in person appointments. Patient expressed understanding and agreed to proceed.  Duration- 20 minutes  Participants in the visit:  patient (location - home);  physician (location - clinic office).  Chief Complaint:  medication management appointment   HPI: 25 yo female, history of depression, anxiety, dating back to adolescence . She was being followed by Dr. Hinton Dyer, who has retired . I am providing outpatient psychiatric coverage pending new clinician starting .  She reports she is doing "all right", although states she continues to experience some residual depressive symptoms.   At this time is working part-time and doing well at work.  She also describes good/stable family relationships and states her mother is very supportive. Reports that her major stressor at this time relates to trying to find full-time employment that includes benefits. She is hoping that she will be able to transition to full-time employment later this year as one of her coworkers who is employed full-time is scheduled to retire, and states she has expressed her interest in this position to her employer. Endorses some mild /intermittent sense of sadness. Improved but some residual sense of anhedonia, although does report that she enjoys spending time with friends and with her family.  Energy level varies.  Sleep normal. No suicidal ideations and presents future oriented.  Reports Zoloft has been helpful and states that in general she feels better than she had been in the past.  She is currently on Zoloft 50 mg daily.  Reports she is tolerating well, but describes some decreased libido as a potential side  effect.  We reviewed treatment options to include titrating Zoloft further or adding another medication for augmentation.  She prefers to avoid medications that may be commonly associated with weight gain.  We reviewed possibility of adding Wellbutrin for augmentation.  Of note, no history of seizures, no history of severe head trauma, no history of eating disorder.                 Visit Diagnosis:  No diagnosis found.  Past Psychiatric History: Please see intake H&P.  Past Medical History: No past medical history on file. No past surgical history on file.  Family Psychiatric History: None  Family History: No family history on file.  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[employer]   Social Determinants of Health   Financial Resource Strain: Not on file  Food Insecurity: Not on file  Transportation Needs: Not on file  Physical Activity: Not on file  Stress: Not on file  Social Connections: Not on file  Allergies: No Known Allergies  Metabolic Disorder Labs: No results found for: HGBA1C, MPG No results found for: PROLACTIN No results found for: CHOL, TRIG, HDL, CHOLHDL, VLDL, LDLCALC No results found for: TSH  Therapeutic Level Labs: No results found for: LITHIUM No results found for: VALPROATE No components found for:  CBMZ  Current Medications: Current Outpatient Medications  Medication Sig Dispense Refill   fluconazole  (DIFLUCAN) 150 MG tablet Take 1 tablet (150 mg total) by mouth once. May repeat in 3 days if symptoms do not resolve (Patient not taking: Reported on 06/17/2020) 2 tablet 0   hydrOXYzine (ATARAX/VISTARIL) 50 MG tablet Take 1 tablet (50 mg total) by mouth daily as needed. 30 tablet 0   sertraline (ZOLOFT) 50 MG tablet Take 1 tablet (50 mg total) by mouth daily. 30 tablet 0   No current facility-administered medications for this visit.     Psychiatric Specialty Exam: note inherent difficulties in obtaining full MSE in the context of phone communication Review of Systems  Constitutional:  Positive for appetite change.  Psychiatric/Behavioral:  Positive for decreased concentration and sleep disturbance. The patient is nervous/anxious.   All other systems reviewed and are negative.  There were no vitals taken for this visit.There is no height or weight on file to calculate BMI.  General Appearance: NA  Eye Contact: NA   Speech:  Normal Rate  Volume:  Normal  Mood:  Reports partially improved mood although describes some mild residual depressive symptoms as above  Affect:  Presents reactive   Thought Process:  Goal Directed  Orientation:  Full (Time, Place, and Person)  Thought Content: no hallucinations, no delusions expressed   Suicidal Thoughts:  No denies SI or self-injurious ideations and presents future oriented  Homicidal Thoughts:  No  Memory:  Recent and remote grossly intact   Judgement:  Good  Insight:  Fair  Psychomotor Activity:  NA  Concentration:  Grossly intact   Recall:  Good  Fund of Knowledge: Good  Language: Good  Akathisia:  Negative  Handed:  Right  AIMS (if indicated): not done  Assets:  Communication Skills Desire for Improvement Financial Resources/Insurance Housing Physical Health Social Support Talents/Skills  ADL's:  Intact  Cognition: WNL  Sleep:  Fair   Screenings: PHQ2-9    Flowsheet Row Video Visit [facility]2020 [facility] Counselor from 06/25/2020 in [facility] Counselor from 06/17/2020 in [facility]  PHQ-2 Total Score 3 3 3   PHQ-9 Total Score 10 7 13       Flowsheet Row Video Visit from 11/04/2020 in [facility]  C-SSRS RISK CATEGORY No Risk        Assessment and Plan:   25 year old female, employed.  History of depression.  Reports noticeable but partial improvement on Zoloft, currently at 50 mg daily.  She does endorse some generally mild/persistent symptoms of depression .  No SI and presents future oriented, for example hoping to switch from part-time to full-time employment later this year.  No psychotic symptoms.  States Zoloft has been helpful and well-tolerated, today does report some possible sexual side effects. We reviewed treatment options.  At this time she prefers not to titrate Zoloft dose further /continue current dose.  We reviewed augmentation options, including possibility of adding Wellbutrin.  She reports she will research this option. Agrees to contact clinic should there be any worsening or concern prior to next appointment.  Dx- MDD, no psychotic features    Plan: Continue Zoloft 50 mgr QDAY for depression  Will see in 4 weeks, agrees to contact clinic sooner if any worsening prior or if any medication concerns    Craige Cotta, MD 03/04/2021, 8:34 AM

## 2021-04-28 ENCOUNTER — Encounter (HOSPITAL_COMMUNITY): Payer: Self-pay | Admitting: Psychiatry

## 2021-04-28 ENCOUNTER — Telehealth (INDEPENDENT_AMBULATORY_CARE_PROVIDER_SITE_OTHER): Payer: BC Managed Care – PPO | Admitting: Psychiatry

## 2021-04-28 ENCOUNTER — Other Ambulatory Visit: Payer: Self-pay

## 2021-04-28 DIAGNOSIS — F320 Major depressive disorder, single episode, mild: Secondary | ICD-10-CM

## 2021-04-28 DIAGNOSIS — F3341 Major depressive disorder, recurrent, in partial remission: Secondary | ICD-10-CM | POA: Diagnosis not present

## 2021-04-28 MED ORDER — SERTRALINE HCL 100 MG PO TABS: 100.0000 mg | ORAL_TABLET | Freq: Every day | ORAL | 1 refills | Status: DC

## 2021-04-28 NOTE — Progress Notes (Signed)
BH MD/PA/NP OP Progress Note  11/25/2020  Stacy Randall  MRN:  [id number] Interview was conducted using telephone  application and I verified that I was speaking with the correct person using two identifiers. I discussed the limitations of evaluation and management by telemedicine and  the availability of in person appointments. Patient expressed understanding and agreed to proceed.  Duration- 20 minutes  Participants in the visit:  patient (location - home);  physician (location - clinic office).  Chief Complaint:  medication management appointment   HPI: 25 yo female, history of depression, anxiety, dating back to adolescence . She was being followed by Dr. Hinton Dyer, who has retired . I am providing outpatient psychiatric coverage pending new clinician starting .  She reports she has been doing relatively well , functioning well in daily activities, but endorses persistent depression. Describes intermittent feelings of sadness, self doubt, decreased energy level . Denies suicidal or self injurious ideations and presents future oriented . Denies hallucinations and does not endorse/present with psychotic symptoms.  She attributes lingering depression in part to recently being fired from her job, which she feels was unfair and will now make it more difficult for her to find another job. She states she is sending in job applications but feels less hopeful about getting responses .  She is future oriented, and we spoke for example about option of going to unemployment office to seek resources and determine if she qualifies for unemployment help while she continues to look for a job.  She reports her parents ( with whom she lives) are supportive, and also has a good friend whom she states is a good support system   Reports energy level as low, no sleep, appetite changes reported .   Overall tolerating Zoloft well . Denies side effects.   We reviewed options, agrees to titrate Zoloft  further to 100 mgr QDAY                    Visit Diagnosis:  No diagnosis found.  Past Psychiatric History: Please see intake H&P.  Past Medical History: No past medical history on file. No past surgical history on file.  Family Psychiatric History: None  Family History: No family history on file.  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[employer]   Social Determinants of Health   Financial Resource Strain: Not on file  Food Insecurity: Not on file  Transportation Needs: Not on file  Physical Activity: Not on file  Stress: Not on file  Social Connections: Not on file    Allergies: No Known Allergies  Metabolic Disorder Labs: No results found for: HGBA1C, MPG No results found for: PROLACTIN No results found for: CHOL, TRIG, HDL, CHOLHDL, VLDL, LDLCALC No results found for: TSH  Therapeutic Level Labs: No results found for: LITHIUM No results found for: VALPROATE No components  found for:  CBMZ  Current Medications: Current Outpatient Medications  Medication Sig Dispense Refill   fluconazole (DIFLUCAN) 150 MG tablet Take 1 tablet (150 mg total) by mouth once. May repeat in 3 days if symptoms do not resolve (Patient not taking: Reported on 06/17/2020) 2 tablet 0   sertraline (ZOLOFT) 50 MG tablet Take 1 tablet (50 mg total) by mouth daily. 30 tablet 2   No current facility-administered medications for this visit.      Psychiatric Specialty Exam: note inherent difficulties in obtaining full MSE in the context of phone communication Review of Systems  Constitutional:  Positive for appetite change.  Psychiatric/Behavioral:  Positive for decreased concentration and sleep disturbance. The patient is nervous/anxious.   All other systems reviewed and are negative.  There were no vitals taken for this visit.There is no height or weight on file to calculate BMI.  General Appearance: NA  Eye Contact: NA   Speech:  Normal Rate  Volume:  Normal  Mood:  reports mood as "OK", but describes some increased sadness related in part to employment stressors, as above  Affect:  reactive, noted to laugh appropriately at times during session  Thought Process:  Goal Directed  Orientation:  Full (Time, Place, and Person)  Thought Content: no hallucinations, no delusions expressed   Suicidal Thoughts:  No denies SI or self-injurious ideations and presents future oriented  Homicidal Thoughts:  No  Memory:  Recent and remote grossly intact   Judgement:  Good  Insight:  Fair  Psychomotor Activity:  NA  Concentration:  Grossly intact   Recall:  Good  Fund of Knowledge: Good  Language: Good  Akathisia:  Negative  Handed:  Right  AIMS (if indicated): not done  Assets:  Communication Skills Desire for Improvement Financial Resources/Insurance Housing Physical Health Social Support Talents/Skills  ADL's:  Intact  Cognition: WNL  Sleep:  Fair   Screenings: PHQ2-9    Flowsheet Row Video Visit from 11/04/2020 in [facility] Counselor from 06/25/2020 in [facility] Counselor from 06/17/2020 in [facility]  PHQ-2 Total Score 3 3 3   PHQ-9 Total Score 10 7 13       Flowsheet Row Video Visit from 11/04/2020 in [facility]  C-SSRS RISK CATEGORY No Risk         Assessment and Plan:   26 year old female. History of depression.    Reports partial improvement on Zoloft, currently taking at 50 mgr QDAY without side effects. Reports some increased depression recently in the context of losing job/current unemployment . No SI and future oriented,sending out job applications but acknowledging increased self doubt following recent job loss . Reviewed coping skills, ego strengths. Parents are supportive . Reviewed options . Agrees to increase Zoloft to 100 mgr QDAY    Dx- MDD, no psychotic features    Plan: Continue Zoloft at 100 mgr QDAY for depression  Will see in 4 weeks, agrees to contact clinic sooner if any worsening prior or if any medication concerns  Reviewed likely benefit of starting individual psychotherapy in addition to current medication management as an added tool to address stressors, mood .    11/06/2020, MD 04/28/2021, 9:12 AM

## 2021-05-22 ENCOUNTER — Other Ambulatory Visit (HOSPITAL_COMMUNITY): Payer: Self-pay | Admitting: Psychiatry

## 2021-05-22 DIAGNOSIS — F3341 Major depressive disorder, recurrent, in partial remission: Secondary | ICD-10-CM

## 2021-05-26 ENCOUNTER — Telehealth (HOSPITAL_COMMUNITY): Payer: BC Managed Care – PPO | Admitting: Psychiatry

## 2021-06-09 ENCOUNTER — Other Ambulatory Visit: Payer: Self-pay

## 2021-06-09 ENCOUNTER — Telehealth (HOSPITAL_COMMUNITY): Payer: BC Managed Care – PPO | Admitting: Psychiatry

## 2021-06-24 ENCOUNTER — Telehealth (HOSPITAL_BASED_OUTPATIENT_CLINIC_OR_DEPARTMENT_OTHER): Payer: BC Managed Care – PPO | Admitting: Psychiatry

## 2021-06-24 ENCOUNTER — Other Ambulatory Visit: Payer: Self-pay

## 2021-06-24 ENCOUNTER — Encounter (HOSPITAL_COMMUNITY): Payer: Self-pay | Admitting: Psychiatry

## 2021-06-24 DIAGNOSIS — F3341 Major depressive disorder, recurrent, in partial remission: Secondary | ICD-10-CM

## 2021-06-24 DIAGNOSIS — F324 Major depressive disorder, single episode, in partial remission: Secondary | ICD-10-CM | POA: Diagnosis not present

## 2021-06-24 MED ORDER — SERTRALINE HCL 100 MG PO TABS: 100.0000 mg | ORAL_TABLET | Freq: Every day | ORAL | 1 refills | Status: DC

## 2021-06-24 NOTE — Progress Notes (Signed)
[facility] MD/PA/NP OP Progress Note  11/25/2020  Stacy Randall  MRN:  [id number] Interview was conducted using telephone  application and I verified that I was speaking with the correct person using two identifiers. I discussed the limitations of evaluation and management by telemedicine and  the availability of in person appointments. Patient expressed understanding and agreed to proceed.  Duration- 20 minutes  Participants in the visit:  patient (location - home);  physician (location - clinic office).  Chief Complaint:  medication management appointment   HPI: 25 yo female, history of depression, anxiety, dating back to adolescence . She was being followed by Dr. Hinton Dyer, who has retired . I am providing outpatient psychiatric coverage pending new clinician starting .  She reports she has been doing well , and describes overall improvement of mood . She currently describes her mood as " all right" and improved compared to prior.  She reports she recently started working full time which  has helped provide more daily structure and some income . 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 She applied , but states hiring process is lengthy .  Reports she is doing well at work and describes home /family relationships are stable/ supportive . Currently does not endorse significant neuro-vegetative symptoms of depression and describes good sleep, appetite, and improving energy level. Denies anhedonia and endorses improved sense of self esteem ( which she had reported as decreased on past appointment partly in the context of not having a job at the time) . Denies suicidal ideations , and presents future oriented . Currently on Zoloft 100 mgr QDAY - denies side effects /tolerating well .                    Visit Diagnosis:  No diagnosis found.  Past Psychiatric History: Please see intake H&P.  Past Medical History: No past medical history on file. No past surgical  history on file.  Family Psychiatric History: None  Family History: No family history on file.  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[employer]   Social Determinants of Health   Financial Resource Strain: Not on file  Food Insecurity: Not on file  Transportation Needs: Not on file  Physical Activity: Not on file  Stress: Not on file  Social Connections: Not on file    Allergies: No Known Allergies  Metabolic Disorder Labs: No results found for: HGBA1C, MPG No results found for: PROLACTIN No results found for: CHOL, TRIG, HDL, CHOLHDL, VLDL, LDLCALC No results found for: TSH  Therapeutic Level Labs: No results found for: LITHIUM No results found for: VALPROATE No components found for:  CBMZ  Current Medications: Current Outpatient Medications  Medication Sig Dispense Refill   fluconazole (DIFLUCAN) 150 MG tablet Take 1 tablet (150 mg total) by mouth once. May  repeat in 3 days if symptoms do not resolve (Patient not taking: Reported on 06/17/2020) 2 tablet 0   sertraline (ZOLOFT) 100 MG tablet Take 1 tablet (100 mg total) by mouth daily. 30 tablet 1   No current facility-administered medications for this visit.     Psychiatric Specialty Exam: note inherent difficulties in obtaining full MSE in the context of phone communication Review of Systems  All other systems reviewed and are negative.  There were no vitals taken for this  visit.There is no height or weight on file to calculate BMI.  General Appearance: NA  Eye Contact: NA   Speech:  Normal Rate  Volume:  Normal  Mood:  reports mood as improved and presents euthymic   Affect:  reactive, fuller in range   Thought Process:  Goal Directed  Orientation:  Full (Time, Place, and Person)  Thought Content: no hallucinations, no delusions expressed   Suicidal Thoughts:  No denies SI or self-injurious ideations and presents future oriented  Homicidal Thoughts:  No  Memory:  Recent and remote grossly intact   Judgement:  Good  Insight:  Fair  Psychomotor Activity:  NA  Concentration:  Grossly intact   Recall:  Good  Fund of Knowledge: Good  Language: Good  Akathisia:  Negative  Handed:  Right  AIMS (if indicated): not done  Assets:  Communication Skills Desire for Improvement Financial Resources/Insurance Housing Physical Health Social Support Talents/Skills  ADL's:  Intact  Cognition: WNL  Sleep:  Fair   Screenings: PHQ2-9    Flowsheet Row Video Visit [facility]2020 in [facility] Counselor from 06/25/2020 in [facility] from 06/17/2020 in [facility]  PHQ-2 Total Score 3 3 3   PHQ-9 Total Score 10 7 13       Flowsheet Row Video Visit from 11/04/2020 in [facility]  C-SSRS RISK CATEGORY No Risk        Assessment and Plan:   25 year old female. History of depression.    She reports improving mood and presents with reactive, full range of affect . Currently does not endorse significant neuro-vegetative symptoms of depression . No SI and presents future oriented . On last appointment employment stressors had been described as a significant stressor. 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  Tolerating Zoloft well . Denies side effects. Side effects reviewed .    Dx- MDD, no psychotic features    Plan: Continue Zoloft at 100 mgr QDAY for depression  Will see in about 6 weeks, agrees to contact clinic sooner if any worsening prior or if any medication concerns . I have insured that patient has clinic phone number as well as crisis hotline number to contact if needed     11/06/2020, MD 06/24/2021, 8:19 AM  Patient ID: Craige Cotta, female   DOB: 09/03/1995, 25 y.o.   MRN: 05/06/1996

## 2021-08-18 ENCOUNTER — Other Ambulatory Visit: Payer: Self-pay

## 2021-08-18 ENCOUNTER — Telehealth (HOSPITAL_BASED_OUTPATIENT_CLINIC_OR_DEPARTMENT_OTHER): Payer: BC Managed Care – PPO | Admitting: Psychiatry

## 2021-08-18 ENCOUNTER — Encounter (HOSPITAL_COMMUNITY): Payer: Self-pay | Admitting: Psychiatry

## 2021-08-18 DIAGNOSIS — F3341 Major depressive disorder, recurrent, in partial remission: Secondary | ICD-10-CM

## 2021-08-18 DIAGNOSIS — F324 Major depressive disorder, single episode, in partial remission: Secondary | ICD-10-CM | POA: Diagnosis not present

## 2021-08-18 MED ORDER — SERTRALINE HCL 100 MG PO TABS: 100.0000 mg | ORAL_TABLET | Freq: Every day | ORAL | 1 refills | Status: DC

## 2021-08-18 NOTE — Progress Notes (Signed)
[place] MD/PA/NP OP Progress Note  11/25/2020  Stacy Randall  MRN:  [id number] Interview was conducted using telephone  application and I verified that I was speaking with the correct person using two identifiers. I discussed the limitations of evaluation and management by telemedicine and  the availability of in person appointments. Patient expressed understanding and agreed to proceed.  Duration- 20 minutes  Participants in the visit:  patient (location - home);  physician (location - clinic office).  Chief Complaint:  medication management appointment   HPI: 26 yo female, history of depression, anxiety, dating back to adolescence . She was being followed by Dr. Montel Culver, who has retired . I am providing outpatient psychiatric coverage pending new clinician starting .  She reports she has been " doing all right ". 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  She reports home situation and relationships ( lives with her parents and brother) have been stable .  Does not endorse significant neuro-vegetative symptoms or anhedonia. Denies SI.  She states her sleep/wake cycle has become more disorganized since she stopped her last job in December, as without the job there has been less daily structure . States she often stays up  at night playing video games or solving puzzles .  We reviewed sleep hygiene techniques , to include setting a specific sleep and  wake time , dark room, avoiding using electronics at night,  avoiding caffeine following 2-3 PM.  She reports she is taking Zoloft  ( 100 mgr QDAY) regularly, denies side effects.                     Visit Diagnosis:  No diagnosis found.  Past Psychiatric History: Please see intake H&P.  Past Medical History: No past medical history on file. No past surgical history  on file.  Family Psychiatric History: None  Family History: No family history on file.  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   Financial Resource Strain: Not on file  Food Insecurity: Not on file  Transportation Needs: Not on file  Physical Activity: Not on file  Stress: Not on file  Social Connections: Not on file    Allergies: No Known Allergies  Metabolic Disorder Labs: No results found for: HGBA1C, MPG No results found for: PROLACTIN No results found for: CHOL, TRIG, HDL, CHOLHDL, VLDL, LDLCALC No results found for: TSH  Therapeutic Level Labs: No results found for: LITHIUM No results found for: VALPROATE No components found for:  CBMZ  Current Medications: Current Outpatient Medications  Medication Sig Dispense Refill   fluconazole (DIFLUCAN) 150 MG tablet Take 1 tablet (  150 mg total) by mouth once. May repeat in 3 days if symptoms do not resolve (Patient not taking: Reported on 06/17/2020) 2 tablet 0   sertraline (ZOLOFT) 100 MG tablet Take 1 tablet (100 mg total) by mouth daily. 30 tablet 1   No current facility-administered medications for this visit.     Psychiatric Specialty Exam: note inherent difficulties in obtaining full MSE in the context of phone communication Review of Systems  All other systems reviewed and are negative.  There were no vitals taken for this  visit.There is no height or weight on file to calculate BMI.  General Appearance: NA  Eye Contact: NA   Speech:  Normal Rate  Volume:  Normal  Mood:  reports mood as " all right "   Affect:  reactive  Thought Process:  Goal Directed  Orientation:  Full (Time, Place, and Person)  Thought Content: no hallucinations, no delusions expressed   Suicidal Thoughts:  No denies SI , and presents future oriented, for example stating she has a test scheduled for later this week as part of a job application process   Homicidal Thoughts:  No  Memory:  Recent and remote grossly intact   Judgement:  Good  Insight:  Fair  Psychomotor Activity:  NA  Concentration:  Grossly intact   Recall:  Good  Fund of Knowledge: Good  Language: Good  Akathisia:  Negative  Handed:  Right  AIMS (if indicated): not done  Assets:  Communication Skills Desire for Improvement Financial Resources/Insurance [place] Talents/Skills  ADL's:  Intact  Cognition: WNL  Sleep:  Fair   Screenings: PHQ2-9    Flowsheet Row Video Visit from 11/04/2020 in [facility] Counselor from 06/25/2020 in [place] Counselor from 06/17/2020 in [place]  PHQ-2 Total Score 3 3 3   PHQ-9 Total Score 10 7 13       Flowsheet Row Video Visit from 11/04/2020 in [facility]  C-SSRS RISK CATEGORY No Risk        Assessment and Plan:   26 year old female. History of depression.    She reports her mood has been " all right" and currently does not endorse persistent sadness or anhedonia. Denies SI. She continues to describe work related stressors. 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 She states however, that she has applied for [employer] job and has an exam and an  interview later this week she is looking forward to. She does report that without the daily structure of a job her sleep wake cycle has become more disorganized as she will often stay up late at night . We reviewed sleep hygiene techniques . She is tolerating Zoloft well, no side effects reported    Dx- MDD, no psychotic features    Plan: Continue Zoloft 100 mgr QDAY for depression  Will see in about 6 weeks, agrees to contact clinic sooner if any worsening prior or if any medication concerns . I have insured that patient has clinic phone number as well as crisis hotline number to contact if needed     Jenne Campus, MD 08/18/2021, 8:36 AM  Patient ID: Stacy Randall, female   DOB: 07/14/1996, 26 y.o.   MRN: [id number]

## 2021-09-07 ENCOUNTER — Emergency Department (HOSPITAL_BASED_OUTPATIENT_CLINIC_OR_DEPARTMENT_OTHER)
Admission: EM | Admit: 2021-09-07 | Discharge: 2021-09-07 | Disposition: A | Discharge status: Home / Self Care | Payer: BC Managed Care – PPO | Attending: Emergency Medicine | Admitting: Emergency Medicine

## 2021-09-07 ENCOUNTER — Encounter (HOSPITAL_BASED_OUTPATIENT_CLINIC_OR_DEPARTMENT_OTHER): Payer: Self-pay | Admitting: Emergency Medicine

## 2021-09-07 ENCOUNTER — Other Ambulatory Visit: Payer: Self-pay

## 2021-09-07 ENCOUNTER — Emergency Department (HOSPITAL_BASED_OUTPATIENT_CLINIC_OR_DEPARTMENT_OTHER): Payer: BC Managed Care – PPO

## 2021-09-07 DIAGNOSIS — S2222XA Fracture of body of sternum, initial encounter for closed fracture: Secondary | ICD-10-CM

## 2021-09-07 DIAGNOSIS — S70211A Abrasion, right hip, initial encounter: Secondary | ICD-10-CM | POA: Insufficient documentation

## 2021-09-07 DIAGNOSIS — Y92410 Unspecified street and highway as the place of occurrence of the external cause: Secondary | ICD-10-CM | POA: Insufficient documentation

## 2021-09-07 DIAGNOSIS — T1490XA Injury, unspecified, initial encounter: Secondary | ICD-10-CM

## 2021-09-07 DIAGNOSIS — R29818 Other symptoms and signs involving the nervous system: Secondary | ICD-10-CM | POA: Diagnosis not present

## 2021-09-07 DIAGNOSIS — S70212A Abrasion, left hip, initial encounter: Secondary | ICD-10-CM | POA: Insufficient documentation

## 2021-09-07 DIAGNOSIS — S299XXA Unspecified injury of thorax, initial encounter: Secondary | ICD-10-CM | POA: Diagnosis not present

## 2021-09-07 HISTORY — DX: Depression, unspecified: F32.A

## 2021-09-07 LAB — CBC WITH DIFFERENTIAL/PLATELET
Abs Immature Granulocytes: 0.08 10*3/uL — ABNORMAL HIGH (ref 0.00–0.07)
Basophils Absolute: 0.1 10*3/uL (ref 0.0–0.1)
Basophils Relative: 1 %
Eosinophils Absolute: 0.1 10*3/uL (ref 0.0–0.5)
Eosinophils Relative: 1 %
HCT: 35.1 % — ABNORMAL LOW (ref 36.0–46.0)
Hemoglobin: 12 g/dL (ref 12.0–15.0)
Immature Granulocytes: 1 %
Lymphocytes Relative: 14 %
Lymphs Abs: 2 10*3/uL (ref 0.7–4.0)
MCH: 32.3 pg (ref 26.0–34.0)
MCHC: 34.2 g/dL (ref 30.0–36.0)
MCV: 94.6 fL (ref 80.0–100.0)
Monocytes Absolute: 1 10*3/uL (ref 0.1–1.0)
Monocytes Relative: 7 %
Neutro Abs: 11.3 10*3/uL — ABNORMAL HIGH (ref 1.7–7.7)
Neutrophils Relative %: 76 %
Platelets: 209 10*3/uL (ref 150–400)
RBC: 3.71 MIL/uL — ABNORMAL LOW (ref 3.87–5.11)
RDW: 11.7 % (ref 11.5–15.5)
WBC: 14.6 10*3/uL — ABNORMAL HIGH (ref 4.0–10.5)
nRBC: 0 % (ref 0.0–0.2)

## 2021-09-07 LAB — COMPREHENSIVE METABOLIC PANEL
ALT: 14 U/L (ref 0–44)
AST: 19 U/L (ref 15–41)
Albumin: 4.9 g/dL (ref 3.5–5.0)
Alkaline Phosphatase: 47 U/L (ref 38–126)
Anion gap: 9 (ref 5–15)
BUN: 13 mg/dL (ref 6–20)
CO2: 25 mmol/L (ref 22–32)
Calcium: 9.4 mg/dL (ref 8.9–10.3)
Chloride: 106 mmol/L (ref 98–111)
Creatinine, Ser: 0.67 mg/dL (ref 0.44–1.00)
GFR, Estimated: >60 mL/min (ref >60)
Glucose, Bld: 94 mg/dL (ref 70–99)
Potassium: 3.7 mmol/L (ref 3.5–5.1)
Sodium: 140 mmol/L (ref 135–145)
Total Bilirubin: 0.7 mg/dL (ref 0.3–1.2)
Total Protein: 8.4 g/dL — ABNORMAL HIGH (ref 6.5–8.1)

## 2021-09-07 LAB — TROPONIN I (HIGH SENSITIVITY): Troponin I (High Sensitivity): 3 ng/L (ref <18)

## 2021-09-07 LAB — PREGNANCY, URINE: Preg Test, Ur: NEGATIVE

## 2021-09-07 MED ORDER — DICLOFENAC SODIUM ER 100 MG PO TB24: 100.0000 mg | ORAL_TABLET | Freq: Every day | ORAL | 0 refills | Status: DC

## 2021-09-07 MED ORDER — ACETAMINOPHEN 500 MG PO TABS
1000.0000 mg | ORAL_TABLET | Freq: Once | ORAL | Status: AC
  Administered 2021-09-07: 1000 mg via ORAL
  Filled 2021-09-07: qty 2

## 2021-09-07 MED ORDER — ONDANSETRON HCL 4 MG/2ML IJ SOLN
4.0000 mg | Freq: Once | INTRAMUSCULAR | Status: AC
  Administered 2021-09-07: 4 mg via INTRAVENOUS
  Filled 2021-09-07: qty 2

## 2021-09-07 MED ORDER — KETOROLAC TROMETHAMINE 30 MG/ML IJ SOLN
15.0000 mg | Freq: Once | INTRAMUSCULAR | Status: AC
  Administered 2021-09-07: 15 mg via INTRAVENOUS
  Filled 2021-09-07: qty 1

## 2021-09-07 MED ORDER — TRAMADOL HCL 50 MG PO TABS: 50.0000 mg | ORAL_TABLET | Freq: Four times a day (QID) | ORAL | 0 refills | Status: DC | PRN

## 2021-09-07 MED ORDER — SODIUM CHLORIDE 0.9 % IV BOLUS
500.0000 mL | Freq: Once | INTRAVENOUS | Status: AC
  Administered 2021-09-07: 500 mL via INTRAVENOUS

## 2021-09-07 MED ORDER — TRAMADOL HCL 50 MG PO TABS
50.0000 mg | ORAL_TABLET | Freq: Once | ORAL | Status: AC
  Administered 2021-09-07: 50 mg via ORAL
  Filled 2021-09-07: qty 1

## 2021-09-07 MED ORDER — IOHEXOL 300 MG/ML  SOLN
100.0000 mL | Freq: Once | INTRAMUSCULAR | Status: AC | PRN
  Administered 2021-09-07: 100 mL via INTRAVENOUS

## 2021-09-07 MED ORDER — ONDANSETRON 8 MG PO TBDP: ORAL_TABLET | ORAL | 0 refills | Status: DC
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
## 2021-10-27 ENCOUNTER — Other Ambulatory Visit: Payer: Self-pay

## 2021-10-27 ENCOUNTER — Telehealth (HOSPITAL_BASED_OUTPATIENT_CLINIC_OR_DEPARTMENT_OTHER): Payer: BC Managed Care – PPO | Admitting: Psychiatry

## 2021-10-27 ENCOUNTER — Encounter (HOSPITAL_COMMUNITY): Payer: Self-pay | Admitting: Psychiatry

## 2021-10-27 DIAGNOSIS — F3341 Major depressive disorder, recurrent, in partial remission: Secondary | ICD-10-CM

## 2021-10-27 DIAGNOSIS — F325 Major depressive disorder, single episode, in full remission: Secondary | ICD-10-CM | POA: Diagnosis not present

## 2021-10-27 MED ORDER — SERTRALINE HCL 100 MG PO TABS: 100.0000 mg | ORAL_TABLET | Freq: Every day | ORAL | 1 refills | Status: DC
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
## 2021-10-28 ENCOUNTER — Telehealth (HOSPITAL_COMMUNITY): Payer: Self-pay | Admitting: Psychiatry

## 2021-11-10 ENCOUNTER — Ambulatory Visit (HOSPITAL_BASED_OUTPATIENT_CLINIC_OR_DEPARTMENT_OTHER): Payer: BC Managed Care – PPO | Admitting: Psychiatry

## 2021-11-10 ENCOUNTER — Encounter (HOSPITAL_COMMUNITY): Payer: Self-pay | Admitting: Psychiatry

## 2021-11-10 DIAGNOSIS — F325 Major depressive disorder, single episode, in full remission: Secondary | ICD-10-CM | POA: Diagnosis not present

## 2021-11-10 DIAGNOSIS — F3341 Major depressive disorder, recurrent, in partial remission: Secondary | ICD-10-CM

## 2021-11-10 MED ORDER — SERTRALINE HCL 100 MG PO TABS: 100.0000 mg | ORAL_TABLET | Freq: Every day | ORAL | 2 refills | Status: DC
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
## 2021-11-11 ENCOUNTER — Encounter (HOSPITAL_COMMUNITY): Payer: Self-pay

## 2021-11-24 ENCOUNTER — Encounter (HOSPITAL_COMMUNITY): Payer: Self-pay

## 2021-11-24 ENCOUNTER — Ambulatory Visit (HOSPITAL_COMMUNITY): Payer: BC Managed Care – PPO | Admitting: Psychiatry

## 2021-12-09 ENCOUNTER — Ambulatory Visit (HOSPITAL_COMMUNITY): Payer: BC Managed Care – PPO | Admitting: Psychiatry

## 2021-12-22 ENCOUNTER — Ambulatory Visit (HOSPITAL_COMMUNITY): Payer: BC Managed Care – PPO | Admitting: Psychiatry

## 2021-12-22 ENCOUNTER — Encounter (HOSPITAL_COMMUNITY): Payer: Self-pay | Admitting: Psychiatry

## 2021-12-22 ENCOUNTER — Telehealth (HOSPITAL_BASED_OUTPATIENT_CLINIC_OR_DEPARTMENT_OTHER): Payer: BC Managed Care – PPO | Admitting: Psychiatry

## 2021-12-22 DIAGNOSIS — F3341 Major depressive disorder, recurrent, in partial remission: Secondary | ICD-10-CM | POA: Diagnosis not present

## 2021-12-22 DIAGNOSIS — F325 Major depressive disorder, single episode, in full remission: Secondary | ICD-10-CM

## 2021-12-22 MED ORDER — SERTRALINE HCL 100 MG PO TABS: 100.0000 mg | ORAL_TABLET | Freq: Every day | ORAL | 2 refills | Status: DC

## 2021-12-22 NOTE — Progress Notes (Signed)
BH MD/PA/NP OP Progress Note ? ?12/22/2021  ?Stacy Randall  ?MRN:  [id number] ?This was a phone based appointment.  Patient's identity verified using 2 different identifiers.  Limitations regarding this type of communication have been reviewed. ?Location of parties ?Patient-Home ?[facility]  ?Duration- 20 minutes  ? ? ?Chief Complaint:  medication management appointment  ? ?HPI: 26 yo female, history of depression, anxiety, dating back to adolescence . ?She was being followed by Dr. Hinton Dyer, who has retired . I am providing outpatient psychiatric coverage pending new clinician starting . ? ? ? ?She reports she has been doing well.  ? ?She has been applying to 2 jobs, one at [employer] and the other at [employer] . She had been anxious about getting hired as the interview process was long and as she had to provide additional paperwork to [employer] based on history of being on an antidepressant .  ?Fortunately she reports she was hired at [employer] and is now completing initial training . She also thinks she may be accepted at [employer] in which case she might consider switching to that position.  ? ?In the context of above reports she has been feeling well, with increased self confidence and overall stable mood . ?Currently euthymic , and presents with full range of affect . Does not endorse anhedonia, sadness or low energy level. Sleep and appetite reported as within normal . ? 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  ? ?Denies PTSD type symptoms. Denies increased anxiety or avoidance symptoms related to driving or being in a vehicle  ? ?Currently on Zoloft 100 mg daily.  Denies side effects.   ?She feels this medication has been helpful and well-tolerated. ? ? ? ? ? ? ? ?            ? ? ?Visit Diagnosis:  ?No diagnosis found. ? ?Past Psychiatric History: Please see intake H&P. ? ?Past Medical History:  ?Past Medical  History:  ?Diagnosis Date  ? Depression   ? No past surgical history on file. ? ?Family Psychiatric History: None ? ?Family History: No family history on file. ? ?Social History:  ?Social History  ? ?Socioeconomic History  ? Marital status: Single  ?  Spouse name: Not on file  ? Number of children: 0  ? Years of education: Not on file  ? Highest education level: Some college, no degree  ?Occupational History  ? Occupation: CNA  ?Tobacco Use  ? Smoking status: Never  ? Smokeless tobacco: Never  ?Vaping Use  ? Vaping Use: Never used  ?Substance and Sexual Activity  ? Alcohol use: No  ? Drug use: Never  ? Sexual activity: Never  ?  Birth control/protection: Abstinence  ?Other Topics Concern  ? Not on file  ?Social History Narrative  ? 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 ?Social Determinants of Health  ? ?Financial Resource Strain: Not on file  ?Food Insecurity: Not on file  ?Transportation Needs: Not on file  ?Physical Activity: Not on file  ?Stress: Not on file  ?Social Connections: Not on file  ? ? ?Allergies: No Known Allergies ? ?Metabolic Disorder Labs: ?No results found for: HGBA1C, MPG ?No results found for: PROLACTIN ?No results found for: CHOL, TRIG, HDL, CHOLHDL, VLDL, LDLCALC ?No results found for: TSH ? ?Therapeutic Level Labs: ?No results found for: LITHIUM ?No results found for: VALPROATE ?No components found for:  CBMZ ? ?Current Medications: ?Current Outpatient Medications  ?Medication Sig Dispense Refill  ? fluconazole (DIFLUCAN) 150 MG tablet Take 1 tablet (150 mg total) by mouth once. May repeat in 3 days if symptoms do not resolve (Patient not taking: Reported on 06/17/2020) 2 tablet 0  ? ondansetron (ZOFRAN-ODT) 8 MG disintegrating tablet 8mg  ODT q8 hours prn nausea 8 tablet 0  ? sertraline (ZOLOFT) 100 MG tablet Take 1 tablet (100 mg total) by mouth daily. 30 tablet 2  ? ?No current facility-administered medications for this visit.  ? ? ? ?Psychiatric Specialty Exam:  ?ROS-none endorsed  ?There were no  vitals taken for this visit.There is no height or weight on file to calculate BMI.  ?General Appearance: NA  ?Eye Contact: NA  ?Speech:  Normal Rate  ?Volume:  Normal  ?Mood: Reports mood as stable/improved and presents euthymic   ?Affect:  reactive, full in range  ?Thought Process:  Goal Directed/linear  ?Orientation:  Full (Time, Place, and Person)  ?Thought Content: no hallucinations, no delusions expressed   ?Suicidal Thoughts:  No no SI and presents future oriented,   ?Homicidal Thoughts:  No  ?Memory:  Recent and remote grossly intact   ?Judgement:  Good  ?Insight:  Fair  ?Psychomotor Activity:  NA  ?Concentration:  Grossly intact   ?Recall:  Good  ?Fund of Knowledge: Good  ?Language: Good  ?Akathisia:  Negative  ?Handed:  Right  ?AIMS (if indicated): not done  ?Assets:  Communication Skills ?Desire for Improvement ?Financial Resources/Insurance ?Housing ?Physical Health ?Social Support ?Talents/Skills  ?ADL's:  Intact  ?Cognition: WNL  ?Sleep:  Fair  ? ?Screenings: ?PHQ2-9   ? ?Flowsheet Row Video [facility] Counselor from 06/25/2020 in [facility] Counselor from 06/17/2020 in [facility]  ?PHQ-2 Total Score 3 3 3   ?PHQ-9 Total Score 10 7 13   ? ?  ? ?Flowsheet Row ED from 09/07/2021 in [facility] [place] [facility] Video Visit from 11/04/2020 in [facility]  ?C-SSRS RISK CATEGORY No Risk No Risk  ? ?  ? ? ? ?Assessment and Plan:  ? ?26 year old female. History of depression, anxiety.   ? ?Currently reports she is doing well . Denies depression and presents euthymic with a full range of affect, does not endorse neuro-vegetative symptoms. Job applications had been a major stressor and reports she has been hired by [employer] which she describes has improved her self confidence .  ? ?Tolerating Zoloft well , denies side  effects ? ? ? ?Dx- MDD in remission ?  ?Plan: Continue Zoloft 100 mgr QDAY for depression ? ?Will see in 6- 8 weeks, agrees to contact clinic sooner if any worsening prior or if any medication concerns .   ?She has clinic phone number as well as crisis hotline number to contact if needed  ? ? ? ?11/06/2020, MD ?12/22/2021, 2:37 PM ? ?Patient ID: Stacy Randall, female   DOB: 1995-11-02, 26 y.o.   MRN: [name] ? ? ?

## 2021-12-29 ENCOUNTER — Emergency Department (INDEPENDENT_AMBULATORY_CARE_PROVIDER_SITE_OTHER)
Admission: EM | Admit: 2021-12-29 | Discharge: 2021-12-29 | Disposition: A | Discharge status: Home / Self Care | Payer: BC Managed Care – PPO | Source: Home / Self Care | Attending: Family Medicine | Admitting: Family Medicine

## 2021-12-29 DIAGNOSIS — H6123 Impacted cerumen, bilateral: Secondary | ICD-10-CM | POA: Diagnosis not present

## 2021-12-29 MED ORDER — NEOMYCIN-POLYMYXIN-HC 3.5-10000-1 OT SUSP: 3.0000 [drp] | Freq: Three times a day (TID) | OTIC | 0 refills | Status: DC

## 2021-12-29 NOTE — ED Provider Notes (Signed)
[facility]    CSN: [id number] Arrival date & time: 12/29/21  0949      History   Chief Complaint Chief Complaint  Patient presents with   Otalgia    LT    HPI Stacy Randall is a 26 y.o. female.   HPI  Patient has a blocked sensation in her ears left greater than right ever since she went to the beach 2 months ago.  No pain.  No change in hearing.  No runny nose or cold symptoms.  She does have some underlying allergies.  She does not think this is the cause.  No drainage from the ear.  No history of ear problems  Past Medical History:  Diagnosis Date   Depression     Patient Active Problem List   Diagnosis Date Noted   Moderate episode of recurrent major depressive disorder (HCC) 11/04/2020   Premenstrual dysphoric disorder 02/07/2020    History reviewed. No pertinent surgical history.  OB History   No obstetric history on file.      Home Medications    Prior to Admission medications   Medication Sig Start Date End Date Taking? Authorizing Provider  neomycin-polymyxin-hydrocortisone (CORTISPORIN) 3.5-10000-1 OTIC suspension Place 3 drops into both ears 3 (three) times daily. 12/29/21  Yes Eustace Moore, MD  ondansetron (ZOFRAN-ODT) 8 MG disintegrating tablet 8mg  ODT q8 hours prn nausea 09/07/21   Palumbo, April, MD  sertraline (ZOLOFT) 100 MG tablet Take 1 tablet (100 mg total) by mouth daily. 12/22/21 02/20/22  Cobos, 02/22/22, MD    Family History History reviewed. No pertinent family history.  Social History Social History   Tobacco Use   Smoking status: Never   Smokeless tobacco: Never  Vaping Use   Vaping Use: Never used  Substance Use Topics   Alcohol use: No   Drug use: Never     Allergies   Patient has no known allergies.   Review of Systems Review of Systems  See HPI Physical Exam Triage Vital Signs ED Triage Vitals [12/29/21 1004]  Enc Vitals Group     BP 109/69     Pulse Rate 70     Resp 18     Temp 97.9  F (36.6 C)     Temp Source Oral     SpO2 98 %     Weight      Height      Head Circumference      Peak Flow      Pain Score 0     Pain Loc      Pain Edu?      Excl. in GC?    No data found.  Updated Vital Signs BP 109/69 (BP Location: Left Arm)   Pulse 70   Temp 97.9 F (36.6 C) (Oral)   Resp 18   LMP 12/09/2021 (Approximate)   SpO2 98%       Physical Exam Constitutional:      General: She is not in acute distress.    Appearance: Normal appearance. She is well-developed and normal weight.  HENT:     Head: Normocephalic and atraumatic.     Right Ear: There is impacted cerumen.     Left Ear: There is impacted cerumen.     Nose: No congestion.     Mouth/Throat:     Pharynx: No posterior oropharyngeal erythema.  Eyes:     Conjunctiva/sclera: Conjunctivae normal.     Pupils: Pupils are equal, round, and reactive to light.  Cardiovascular:     Rate and Rhythm: Normal rate.  Pulmonary:     Effort: Pulmonary effort is normal. No respiratory distress.  Abdominal:     General: There is no distension.     Palpations: Abdomen is soft.  Musculoskeletal:        General: Normal range of motion.     Cervical back: Normal range of motion.  Skin:    General: Skin is warm and dry.  Neurological:     Mental Status: She is alert.  Psychiatric:        Mood and Affect: Mood normal.     UC Treatments / Results  Labs (all labs ordered are listed, but only abnormal results are displayed) Labs Reviewed - No data to display  EKG   Radiology No results found.  Procedures Procedures (including critical care time)  Medications Ordered in UC Medications - No data to display  Initial Impression / Assessment and Plan / UC Course  I have reviewed the triage vital signs and the nursing notes.  Pertinent labs & imaging results that were available during my care of the patient were reviewed by me and considered in my medical decision making (see chart for details).      Both ears were irrigated and curetted clear with the assistance of the nurse. Final Clinical Impressions(s) / UC Diagnoses   Final diagnoses:  Bilateral impacted cerumen     Discharge Instructions      Use eardrops for a few days     ED Prescriptions     Medication Sig Dispense Auth. Provider   neomycin-polymyxin-hydrocortisone (CORTISPORIN) 3.5-10000-1 OTIC suspension Place 3 drops into both ears 3 (three) times daily. 10 mL Eustace Moore, MD      PDMP not reviewed this encounter.   Eustace Moore, MD 12/29/21 1115

## 2021-12-29 NOTE — ED Triage Notes (Signed)
Pt c/o ear fullness x 2 months. Says she "feels like shes under water". OTC swimmers ear drops tried no relief.

## 2021-12-29 NOTE — Discharge Instructions (Signed)
Use eardrops for a few days

## 2022-02-16 ENCOUNTER — Encounter (HOSPITAL_COMMUNITY): Payer: Self-pay | Admitting: Psychiatry

## 2022-02-16 ENCOUNTER — Telehealth (HOSPITAL_BASED_OUTPATIENT_CLINIC_OR_DEPARTMENT_OTHER): Payer: BC Managed Care – PPO | Admitting: Psychiatry

## 2022-02-16 DIAGNOSIS — F325 Major depressive disorder, single episode, in full remission: Secondary | ICD-10-CM

## 2022-02-16 DIAGNOSIS — F3341 Major depressive disorder, recurrent, in partial remission: Secondary | ICD-10-CM

## 2022-02-16 MED ORDER — SERTRALINE HCL 100 MG PO TABS: 100.0000 mg | ORAL_TABLET | Freq: Every day | ORAL | 1 refills | Status: DC

## 2022-02-16 NOTE — Progress Notes (Signed)
BH MD/PA/NP OP Progress Note  02/16/2022   Stacy Randall  MRN:  [id number] This was a phone based appointment.  Patient's identity verified using 2 different identifiers.  Limitations regarding this type of communication have been reviewed. Location of parties Patient-Home [facility]  Duration- 20 minutes    Chief Complaint:  medication management appointment   HPI: 26 yo female, history of depression, anxiety, dating back to adolescence . She was being followed by Dr. Hinton Dyer, who has retired . I am providing outpatient psychiatric coverage pending new clinician starting .    She reports she has been doing well.   She reports mood has been stable and denies feeling depressed. Does not endorse neuro-vegetative symptoms of depression and presents euthymic, with full range of affect . Anxiety symptoms have also improved/subsided .  She reports she is doing well at work ( currently working in the post office , but planning on switching to work with [employer] . Reports she was hired by [employer] but has not started yet , and is in the process of getting her badge set up, etc , before starting to work ) . She also reports stable and supportive family environment ( lives with her parents ) .  She is tolerating Zoloft well, denies side effects .                        Visit Diagnosis:  No diagnosis found.  Past Psychiatric History: Please see intake H&P.  Past Medical History:  Past Medical History:  Diagnosis Date   Depression    No past surgical history on file.  Family Psychiatric History: None  Family History: No family history on file.  Social History:  Social History   Socioeconomic History   Marital status: Single    Spouse name: Not on file   Number of children: 0   Years of education: Not on file   Highest education level: Some college, no degree  Occupational History   Occupation: CNA  Tobacco Use   Smoking status: Never   Smokeless tobacco: Never   Vaping Use   Vaping Use: Never used  Substance and Sexual Activity   Alcohol use: No   Drug use: Never   Sexual activity: Never    Birth control/protection: Abstinence  Other Topics Concern   Not on file  Social History Narrative   CNA through [employer]   Social Determinants of Health   Financial Resource Strain: Not on file  Food Insecurity: Not on file  Transportation Needs: Not on file  Physical Activity: Not on file  Stress: Not on file  Social Connections: Not on file    Allergies: No Known Allergies  Metabolic Disorder Labs: No results found for: "HGBA1C", "MPG" No results found for: "PROLACTIN" No results found for: "CHOL", "TRIG", "HDL", "CHOLHDL", "VLDL", "LDLCALC" No results found for: "TSH"  Therapeutic Level Labs: No results found for: "LITHIUM" No results found for: "VALPROATE" No results found for: "CBMZ"  Current Medications: Current Outpatient Medications  Medication Sig Dispense Refill   neomycin-polymyxin-hydrocortisone (CORTISPORIN) 3.5-10000-1 OTIC suspension Place 3 drops into both ears 3 (three) times daily. 10 mL 0   ondansetron (ZOFRAN-ODT) 8 MG disintegrating tablet 8mg  ODT q8 hours prn nausea 8 tablet 0   sertraline (ZOLOFT) 100 MG tablet Take 1 tablet (100 mg total) by mouth daily. 30 tablet 2   No current facility-administered medications for this visit.     Psychiatric Specialty Exam: Please note limitations  in obtaining full mental status exam in the context of phone assessment ROS-none endorsed  There were no vitals taken for this visit.There is no height or weight on file to calculate BMI.  General Appearance: NA  Eye Contact: NA  Speech:  Normal Rate  Volume:  Normal  Mood: Reports mood as improved and currently is euthymic  Affect:  reactive, full in range  Thought Process:  Goal Directed/linear  Orientation:  Full (Time, Place, and Person)  Thought Content: no hallucinations, no delusions expressed   Suicidal Thoughts:   No no SI and presents future oriented, looking forward to starting employment at [employer]  Homicidal Thoughts:  No  Memory:  Recent and remote grossly intact   Judgement:  Good  Insight:  Fair  Psychomotor Activity:  NA  Concentration:  Grossly intact   Recall:  Good  Fund of Knowledge: Good  Language: Good  Akathisia:  Negative  Handed:  Right  AIMS (if indicated): not done  Assets:  Communication Skills Desire for Improvement Financial Resources/Insurance Housing Physical Health Social Support Talents/Skills  ADL's:  Intact  Cognition: WNL  Sleep:  Fair   Screenings: PHQ2-9    Flowsheet Row Video Visit from 11/04/2020 [facility] Counselor from 06/25/2020 in [facility] Counselor from 06/17/2020 in [facility]  PHQ-2 Total Score 3 3 3   PHQ-9 Total Score 10 7 13       Flowsheet Row ED from 12/29/2021 in [facility] ED from 09/07/2021 in [facility] [place] [facility] Video Visit from 11/04/2020 in [facility]  C-SSRS RISK CATEGORY No Risk No Risk No Risk        Assessment and Plan:   26 year old female. History of depression, anxiety.    Currently reports she is doing well/ functioning well in daily activities. Presents euthymic and does not endorse neurovegetative symptoms of depression at this time. Presents future oriented and  reports she is looking forward to starting her new job at 11/06/2020 in the near future . Tolerating Zoloft well . No side effects reported       Dx- MDD in remission   Plan: Continue Zoloft 100 mgr QDAY for depression  Will see in 6- 8 weeks, agrees to contact clinic sooner if any worsening prior or if any medication concerns .   She has clinic phone number as well as crisis hotline number to contact if needed     25, MD 02/16/2022, 1:12  PM  Patient ID: Craige Cotta, female   DOB: [birth year], 26 y.o.   MRN: [id number]/06/1997

## 2022-03-30 ENCOUNTER — Encounter (HOSPITAL_COMMUNITY): Payer: Self-pay | Admitting: Psychiatry

## 2022-03-30 ENCOUNTER — Telehealth (HOSPITAL_BASED_OUTPATIENT_CLINIC_OR_DEPARTMENT_OTHER): Payer: BC Managed Care – PPO | Admitting: Psychiatry

## 2022-03-30 DIAGNOSIS — F325 Major depressive disorder, single episode, in full remission: Secondary | ICD-10-CM

## 2022-03-30 DIAGNOSIS — F3341 Major depressive disorder, recurrent, in partial remission: Secondary | ICD-10-CM

## 2022-03-30 MED ORDER — SERTRALINE HCL 100 MG PO TABS: 100.0000 mg | ORAL_TABLET | Freq: Every day | ORAL | 1 refills | Status: DC

## 2022-03-30 NOTE — Progress Notes (Signed)
[facility] MD/PA/NP OP Progress Note  02/16/2022   Stacy Randall  MRN:  [id number] This was a phone based appointment.  Patient's identity verified using 2 different identifiers.  Limitations regarding this type of communication have been reviewed. Location of parties [facility]  Duration- 20 minutes    Chief Complaint:  medication management appointment   HPI: 26 yo female, history of depression, anxiety, dating back to adolescence . She was being followed by Dr. Montel Culver, who has retired . I am providing outpatient psychiatric coverage pending new clinician starting .   She reports she has been doing well .  Describes generally improved mood.  She reports that she has had friends and family have commented on that she seems to be doing better and that she appears happier.   She presents euthymic and with a full range of affect.  No suicidal ideations.  Does not endorse significant neurovegetative symptoms, and describes sleep /energy level/appetite as within normal..  No anhedonia.  Anxiety related symptoms have also improved and currently does not endorse significant worry or ruminations.  She does describe some persistent premenstrual dysphoria and describes increased affective lability on days prior to onset of menstrual period.  I have encouraged her to review this with her Ob/Gyn as well .   She presents future oriented.  For example, states that she is planning a trip to [place] with her mother to visit family later this week.  She is focused on work opportunities - currently working as a Presenter, broadcasting and as a Education officer, community , as she waits to start [employer] job which she was hired to but has not actually started yet. She also proudly reports she recently purchased a car .   Tolerating Zoloft well thus far.  She is currently taking 100 mg daily.  She is interested in tapering back down to 50 mg daily, as is concerned  higher antidepressant dose may be more likely  to cause side effects.  We reviewed concerns and side effect profile.                       Visit Diagnosis:  No diagnosis found.  Past Psychiatric History: Please see intake H&P.  Past Medical History:  Past Medical History:  Diagnosis Date   Depression    No past surgical history on file.  Family Psychiatric History: None  Family History: No family history on file.  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   Financial Resource Strain: Not on file  Food Insecurity: Not on file  Transportation Needs: Not on file  Physical Activity: Not on file  Stress: Not on file  Social Connections: Not on file    Allergies: No Known Allergies  Metabolic Disorder Labs: No results found for: "HGBA1C", "MPG" No results  found for: "PROLACTIN" No results found for: "CHOL", "TRIG", "HDL", "CHOLHDL", "VLDL", "LDLCALC" No results found for: "TSH"  Therapeutic Level Labs: No results found for: "LITHIUM" No results found for: "VALPROATE" No results found for: "CBMZ"  Current Medications: Current Outpatient Medications  Medication Sig Dispense Refill   neomycin-polymyxin-hydrocortisone (CORTISPORIN) 3.5-10000-1 OTIC suspension Place 3 drops into both ears 3 (three) times daily. 10 mL 0   ondansetron (ZOFRAN-ODT) 8 MG disintegrating tablet 8mg  ODT q8 hours prn nausea 8 tablet 0   sertraline (ZOLOFT)  100 MG tablet Take 1 tablet (100 mg total) by mouth daily. 30 tablet 1   No current facility-administered medications for this visit.     Psychiatric Specialty Exam: Please note limitations in obtaining full mental status exam in the context of phone assessment ROS-none endorsed  There were no vitals taken for this visit.There is no height or weight on file to calculate BMI.  General Appearance: NA  Eye Contact: NA  Speech:  Normal Rate  Volume:  Normal  Mood: Reports mood has improved and currently presents euthymic.  Of note, she also reports that friends have noted her mood to be improved  Affect:  reactive, full in range  Thought Process:  Goal Directed/linear  Orientation:  Full (Time, Place, and Person)  Thought Content: no hallucinations, no delusions expressed .  Focused on work-related issues.  Also planning an upcoming out-of-state trip with her mother.  Suicidal Thoughts:  No no SI and presents future oriented  Homicidal Thoughts:  No  Memory:  Recent and remote grossly intact   Judgement:  Good  Insight:  Fair  Psychomotor Activity:  NA  Concentration:  Grossly intact   Recall:  Good  Fund of Knowledge: Good  Language: Good  Akathisia:  Negative  Handed:  Right  AIMS (if indicated): not done  Assets:  Communication Skills Desire for Improvement Financial Resources/Insurance Housing Physical Health Social Support Talents/Skills  ADL's:  Intact  Cognition: WNL  Sleep:  Fair   Screenings: PHQ2-9    Flowsheet Row Video Visit from [facility]/04[facility] Counselor from 06/25/2020 in [facility] Counselor from 06/17/2020 in [facility]  PHQ-2 Total Score 3 3 3   PHQ-9 Total Score 10 7 13       Flowsheet Row ED from 12/29/2021 in [facility] at [facility] ED from 09/07/2021 in [facility] [place] [facility] Video Visit from  11/04/2020 in [facility]  C-SSRS RISK CATEGORY No Risk No Risk No Risk        Assessment and Plan:   26 year old female. History of depression, anxiety.    At this time she presents stable and euthymic.  Full range of affect.  No SI.  No significant neurovegetative symptoms.  Anxiety symptoms improved as well.  She is focused on work and currently working 2 jobs (as a [employer] and as a 11/06/2020) while she waits for her [employer] position to open.  She is tolerating Zoloft well thus far.  She does express wanting to decrease Zoloft back down to 50 mg daily, to minimize potential side effect risk.  We reviewed concerns/provided reassurance regarding sertraline tolerance/safety profile.  She does endorse some persistent premenstrual symptoms which are described as vague dysphoria and affective lability on days preceding onset of menses.      Dx- MDD in remission   Plan: Continue Zoloft 100 mgr QDAY for depression  Will see in 6-  8 weeks, agrees to contact clinic sooner if any worsening prior or if any medication concerns .   She has clinic phone number as well as crisis hotline number to contact if needed     Craige Cotta, MD 03/30/2022, 2:35 PM  Patient ID: Stacy Randall, female   DOB: 1995/11/27, 26 y.o.   MRN: [id number]

## 2022-05-14 ENCOUNTER — Encounter (HOSPITAL_COMMUNITY): Payer: Self-pay | Admitting: Psychiatry

## 2022-05-14 ENCOUNTER — Telehealth (HOSPITAL_BASED_OUTPATIENT_CLINIC_OR_DEPARTMENT_OTHER): Payer: BC Managed Care – PPO | Admitting: Psychiatry

## 2022-05-14 DIAGNOSIS — F3341 Major depressive disorder, recurrent, in partial remission: Secondary | ICD-10-CM | POA: Diagnosis not present

## 2022-05-14 MED ORDER — SERTRALINE HCL 50 MG PO TABS: 50.0000 mg | ORAL_TABLET | Freq: Every day | ORAL | 1 refills | Status: DC

## 2022-05-14 NOTE — Progress Notes (Signed)
BH MD/PA/NP OP Progress Note 05/14/2022:  Stacy Randall  MRN:  [id number] This was a phone based appointment.  Patient's identity verified using 2 different identifiers.  Limitations regarding this type of communication have been reviewed. Location of parties Patient-Home [facility]  Duration- 20 minutes    Chief Complaint:  medication management appointment   HPI: 26 yo female, history of depression, anxiety, dating back to adolescence . She was being followed by Dr. Hinton Dyer, who has retired . I am providing outpatient psychiatric coverage pending new clinician starting .    Ms Vanallen reports she has been doing well and describes her mood as improved.  She reports her mood has been generally stable and denies anhedonia or other neurovegetative symptoms.  Presents euthymic with a full range of affect at this time.  Anxiety symptoms have also improved.    She is currently functioning well in her daily activities which include working full-time-currently she reports she is working for [employer] as she waits [place] . ( Reports was accepted but has not formally been given a starting date or schedule yet ) .   She reports stable relationship with her parents and sibling,  with whom she lives.  Denies medication side effects, she is currently taking Zoloft at 50 mg daily.                           Visit Diagnosis:  No diagnosis found.  Past Psychiatric History: Please see intake H&P.  Past Medical History:  Past Medical History:  Diagnosis Date   Depression    No past surgical history on file.  Family Psychiatric History: None  Family History: No family history on file.  Social History:  Social History   Socioeconomic History   Marital status: Single    Spouse name: Not on file   Number of children: 0   Years of education: Not on file   Highest education level: Some college, no degree  Occupational  History   Occupation: CNA  Tobacco Use   Smoking status: Never   Smokeless tobacco: Never  Vaping Use   Vaping Use: Never used  Substance and Sexual Activity   Alcohol use: No   Drug use: Never   Sexual activity: Never    Birth control/protection: Abstinence  Other Topics Concern   Not on file  Social History Narrative   CNA through [employer]   Social Determinants of Health   Financial Resource Strain: Not on file  Food Insecurity: Not on file  Transportation Needs: Not on file  Physical Activity: Not on file  Stress: Not on file  Social Connections: Not on file    Allergies: No Known Allergies  Metabolic Disorder Labs: No results found for: "HGBA1C", "MPG" No results found for: "PROLACTIN" No results found for: "CHOL", "TRIG", "HDL", "CHOLHDL", "VLDL", "LDLCALC" No results found for: "TSH"  Therapeutic Level Labs: No results found for: "LITHIUM" No results found for: "VALPROATE" No results found for: "CBMZ"  Current Medications: Current Outpatient Medications  Medication Sig Dispense Refill   neomycin-polymyxin-hydrocortisone (CORTISPORIN) 3.5-10000-1 OTIC suspension Place 3 drops into both ears 3 (three) times daily. 10 mL 0   sertraline (ZOLOFT) 100 MG tablet Take 1 tablet (100 mg total) by mouth daily. 30 tablet 1   No current facility-administered medications for this visit.     Psychiatric Specialty Exam: Please note limitations in obtaining full mental status  exam in the context of phone assessment ROS-none endorsed  There were no vitals taken for this visit.There is no height or weight on file to calculate BMI.  General Appearance: NA  Eye Contact: NA  Speech:  Normal Rate  Volume:  Normal  Mood: Describes stable/improved mood and presents euthymic  Affect: Appropriate.  Presents full in range  Thought Process:  Goal Directed/linear  Orientation:  Full (Time, Place, and Person)  Thought Content: no hallucinations, no delusions expressed .     Suicidal Thoughts:  No no SI and presents future oriented  Homicidal Thoughts:  No  Memory:  Recent and remote grossly intact   Judgement:  Good  Insight:  Fair  Psychomotor Activity:  NA  Concentration:  Grossly intact   Recall:  Good  Fund of Knowledge: Good  Language: Good  Akathisia:  Negative  Handed:  Right  AIMS (if indicated): not done  Assets:  Communication Skills Desire for Improvement Financial Resources/Insurance [place] Talents/Skills  ADL's:  Intact  Cognition: WNL  Sleep:  Fair   Screenings: PHQ2-9    Flowsheet Row Video Visit from 11/04/2020 in [facility] from 06/25/2020 in [facility] from 06/17/2020 in [facility]  PHQ-2 Total Score 3 3 3   PHQ-9 Total Score 10 7 13       [place] ED from 12/29/2021 in [facility] ED from 09/07/2021 in [place] Video Visit from 11/04/2020 in [facility]  C-SSRS RISK CATEGORY No Risk No Risk No Risk        Assessment and Plan:   26 year-old female. History of depression, anxiety.    She reports she is doing well.  Describes improved mood and presents euthymic.  Does not endorse anhedonia or other neurovegetative symptoms.  Denies significant anxiety.  Focused on employment issues and currently working at [employer] but waiting to start full-time work at [employer].  Tolerating Zoloft well which she states has been effective and well-tolerated.  Currently at 50 mg daily.      Dx- MDD in remission   Plan: Continue Zoloft 50 mgr QDAY for depression  Will see in 6- 8 weeks, agrees to contact clinic sooner if any worsening prior or if any medication concerns .   She has clinic phone number as well as crisis hotline number to contact if needed      Jenne Campus, MD 05/14/2022, 4:00 PM  Patient ID: Stacy Randall, female   DOB: 1995-09-18, 26 y.o.   MRN: [id number]

## 2022-05-14 NOTE — Progress Notes (Signed)
[place] MD/PA/NP OP Progress Note  02/16/2022   Stacy Randall  MRN:  [id number] This was a phone based appointment.  Patient's identity verified using 2 different identifiers.  Limitations regarding this type of communication have been reviewed. Location of parties [facility]  Duration- 20 minutes    Chief Complaint:  medication management appointment   HPI: 26 yo female, history of depression, anxiety, dating back to adolescence . She was being followed by Dr. Montel Culver, who has retired . I am providing outpatient psychiatric coverage pending new clinician starting .   She reports she has been doing well .  Describes generally improved mood.  She reports that she has had friends and family have commented on that she seems to be doing better and that she appears happier.   She presents euthymic and with a full range of affect.  No suicidal ideations.  Does not endorse significant neurovegetative symptoms, and describes sleep /energy level/appetite as within normal..  No anhedonia.  Anxiety related symptoms have also improved and currently does not endorse significant worry or ruminations.  She does describe some persistent premenstrual dysphoria and describes increased affective lability on days prior to onset of menstrual period.  I have encouraged her to review this with her Ob/Gyn as well .   She presents future oriented.  For example, states that she is planning a trip to [place] with her mother to visit family later this week.  She is focused on work opportunities - currently working as a Presenter, broadcasting and as a Education officer, community , as she waits to start [employer] job which she was hired to but has not actually started yet. She also proudly reports she recently purchased a car .   Tolerating Zoloft well thus far.  She is currently taking 100 mg daily.  She is interested in tapering back down to 50 mg daily, as is concerned  higher antidepressant dose may be more likely  to cause side effects.  We reviewed concerns and side effect profile.                       Visit Diagnosis:  No diagnosis found.  Past Psychiatric History: Please see intake H&P.  Past Medical History:  Past Medical History:  Diagnosis Date   Depression    No past surgical history on file.  Family Psychiatric History: None  Family History: No family history on file.  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   Financial Resource Strain: Not on file  Food Insecurity: Not on file  Transportation Needs: Not on file  Physical Activity: Not on file  Stress: Not on file  Social Connections: Not on file    Allergies: No Known Allergies  Metabolic Disorder Labs: No results found for: "HGBA1C", "MPG" No results  found for: "PROLACTIN" No results found for: "CHOL", "TRIG", "HDL", "CHOLHDL", "VLDL", "LDLCALC" No results found for: "TSH"  Therapeutic Level Labs: No results found for: "LITHIUM" No results found for: "VALPROATE" No results found for: "CBMZ"  Current Medications: Current Outpatient Medications  Medication Sig Dispense Refill   neomycin-polymyxin-hydrocortisone (CORTISPORIN) 3.5-10000-1 OTIC suspension Place 3 drops into both ears 3 (three) times daily. 10 mL 0   sertraline (ZOLOFT) 100 MG tablet Take 1 tablet (100 mg total) by mouth daily. 30 tablet 1   No current  facility-administered medications for this visit.     Psychiatric Specialty Exam: Please note limitations in obtaining full mental status exam in the context of phone assessment ROS-none endorsed  There were no vitals taken for this visit.There is no height or weight on file to calculate BMI.  General Appearance: NA  Eye Contact: NA  Speech:  Normal Rate  Volume:  Normal  Mood: Reports mood has improved and currently presents euthymic.  Of note, she also reports that friends have noted her mood to be improved  Affect:  reactive, full in range  Thought Process:  Goal Directed/linear  Orientation:  Full (Time, Place, and Person)  Thought Content: no hallucinations, no delusions expressed .  Focused on work-related issues.  Also planning an upcoming out-of-state trip with her mother.  Suicidal Thoughts:  No no SI and presents future oriented  Homicidal Thoughts:  No  Memory:  Recent and remote grossly intact   Judgement:  Good  Insight:  Fair  Psychomotor Activity:  NA  Concentration:  Grossly intact   Recall:  Good  Fund of Knowledge: Good  Language: Good  Akathisia:  Negative  Handed:  Right  AIMS (if indicated): not done  Assets:  Communication Skills Desire for Improvement Financial Resources/Insurance Housing Physical Health Social Support Talents/Skills  ADL's:  Intact  Cognition: WNL  Sleep:  Fair   Screenings: PHQ2-9    Flowsheet Row [facility] from [facility]/25/2020 [facility] from 06/17/2020 [facility]  PHQ-2 Total Score 3 3 3   PHQ-9 Total Score 10 7 13       Flowsheet Row ED from 12/29/2021 in [facility] ED from 09/07/2021 in [facility] [facility] Video Visit from 11/04[facility]2020 [facility]  C-SSRS RISK CATEGORY  No Risk No Risk No Risk        Assessment and Plan:   26 year old female. History of depression, anxiety.    At this time she presents stable and euthymic.  Full range of affect.  No SI.  No significant neurovegetative symptoms.  Anxiety symptoms improved as well.  She is focused on work and currently working 2 jobs (as a 11/06/2020 and as a 25) while she waits for her [employer] position to open.  She is tolerating Zoloft well thus far.  She does express wanting to decrease Zoloft back down to 50 mg daily, to minimize potential side effect risk.  We reviewed concerns/provided reassurance regarding sertraline tolerance/safety profile.  She does endorse some persistent premenstrual symptoms which are described as vague dysphoria and affective lability on days preceding onset of menses.      Dx- MDD in remission   Plan: Continue Zoloft 100 mgr QDAY for depression  Will see in 6- 8 weeks, agrees to contact clinic sooner if any worsening prior or if any medication concerns .  She has clinic phone number as well as crisis hotline number to contact if needed     Craige Cotta, MD 05/14/2022, 1:11 PM  Patient ID: Stacy Randall, female   DOB: 1996/01/06, 26 y.o.   MRN: [id number]

## 2022-06-07 ENCOUNTER — Other Ambulatory Visit (HOSPITAL_COMMUNITY): Payer: Self-pay | Admitting: Psychiatry

## 2022-06-07 DIAGNOSIS — F3341 Major depressive disorder, recurrent, in partial remission: Secondary | ICD-10-CM

## 2022-07-07 ENCOUNTER — Telehealth (HOSPITAL_COMMUNITY): Payer: BC Managed Care – PPO | Admitting: Psychiatry

## 2022-07-08 ENCOUNTER — Encounter (HOSPITAL_COMMUNITY): Payer: Self-pay | Admitting: Psychiatry

## 2022-07-08 ENCOUNTER — Telehealth (HOSPITAL_BASED_OUTPATIENT_CLINIC_OR_DEPARTMENT_OTHER): Payer: BC Managed Care – PPO | Admitting: Psychiatry

## 2022-07-08 DIAGNOSIS — F325 Major depressive disorder, single episode, in full remission: Secondary | ICD-10-CM

## 2022-07-08 DIAGNOSIS — F3341 Major depressive disorder, recurrent, in partial remission: Secondary | ICD-10-CM | POA: Diagnosis not present

## 2022-07-08 MED ORDER — SERTRALINE HCL 50 MG PO TABS: 50.0000 mg | ORAL_TABLET | Freq: Every day | ORAL | 1 refills | Status: DC

## 2022-07-08 NOTE — Progress Notes (Signed)
BH MD/PA/NP OP Progress Note 05/14/2022:  Stacy Randall  MRN:  [id number] This was a phone based appointment.  Patient's identity verified using 2 different identifiers.  Limitations regarding this type of communication have been reviewed. Location of parties Patient-Home [facility]  Duration- 20 minutes    Chief Complaint:  medication management appointment   HPI: 26 yo female, history of depression, anxiety, dating back to adolescence . She was being followed by Dr. Hinton Dyer, who has retired . I am providing outpatient psychiatric coverage pending new clinician starting .   Stacy Randall reports that she has been doing well in daily activities .  She reports mood as "all right" and presents euthymic with a full range of affect.  Anxiety symptoms are described as improved. No significant neurovegetative symptoms are endorsed at this time. She continues to focus on work, and reports that she enjoys working.  Currently, continues to work 2 jobs-at [employer] and for [employer].  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   She reports stable family relationships.   At this time does not endorse significant stressors and states "everything is going okay"  She is taking Zoloft at 50 mg daily without side effects.  She feels this medication has been helpful.  She occasionally takes hydroxyzine PRN for anxiety .  No side effects reported .   Visit Diagnosis:  No diagnosis found.  Past Psychiatric History: Please see intake H&P.  Past Medical History:  Past Medical History:  Diagnosis Date   Depression    No past surgical history on file.  Family Psychiatric History: None  Family History: No family history on file.  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[employer]   Social Determinants of Health   Financial Resource Strain: Not on file  Food Insecurity: Not on file  Transportation Needs: Not on file  Physical Activity: Not on file  Stress: Not on file  Social Connections: Not on file    Allergies: No Known Allergies  Metabolic Disorder Labs: No results found for: "HGBA1C", "MPG" No results found for: "PROLACTIN" No results found for: "CHOL", "TRIG", "HDL", "CHOLHDL", "VLDL", "LDLCALC" No results found for: "TSH"  Therapeutic Level Labs: No results found for: "LITHIUM" No results found for: "VALPROATE" No results found for: "CBMZ"  Current Medications: Current Outpatient Medications  Medication Sig Dispense Refill   neomycin-polymyxin-hydrocortisone (CORTISPORIN) 3.5-10000-1 OTIC suspension Place 3 drops into both ears 3 (three) times daily. 10 mL 0   sertraline (ZOLOFT) 50 MG tablet Take 1 tablet (50 mg total) by mouth daily. 30 tablet 1   No current facility-administered medications for this visit.     Psychiatric Specialty Exam: Please note limitations in obtaining full mental status exam in the context of phone assessment ROS- reports recent outpatient MD visit for possible spider bite-  now improved   There were no vitals taken for this visit.There is no height or weight on file to calculate BMI.  General Appearance: NA  Eye Contact: NA  Speech:  Normal Rate  Volume:  Normal  Mood: stable, euthymic   Affect: Appropriate/ full in range  Thought Process:  Goal Directed/linear  Orientation:  Full (Time, Place, and Person)  Thought Content: no hallucinations,  no delusions expressed .    Suicidal Thoughts:  No no SI and presents future oriented  Homicidal Thoughts:  No  Memory:  Recent and remote grossly intact   Judgement:  Good  Insight:  Fair  Psychomotor Activity:  NA  Concentration:  Grossly intact   Recall:  Good  Fund of Knowledge: Good  Language: Good  Akathisia:  Negative  Handed:  Right  AIMS (if indicated): not done  Assets:  Communication Skills Desire for Improvement Financial Resources/Insurance Housing Physical Health Social Support Talents/Skills  ADL's:  Intact  Cognition: WNL  Sleep:  Fair   Screenings: PHQ2-9    Flowsheet Row [facility] from 06/25/2020 in [facility] from 06/17/2020 in [facility]  PHQ-2 Total Score 3 3 3   PHQ-9 Total Score 10 7 13       Flowsheet Row ED from 12/29/2021 in [facility] ED from 09/07/2021 in [facility] [place] [facility] Video Visit from 11/04/2020 [facility]  C-SSRS RISK CATEGORY No Risk No Risk No Risk        Assessment and Plan:   26 year-old female. History of depression, anxiety.    Stacy Randall reports she is doing well . Presents euthymic, with a full range of affect and does not endorse significant neuro-vegetative symptoms. Anxiety described as improved . She  continues to focus on work and describes enjoying her current job. 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   On Zoloft at 50 mgr QDAY which she is tolerating well . No side effects reported .  Writer is leaving clinic in March 2023 , after which, if she wants , will transition to following up with  another [facility] clinic provider. Expresses understanding.        Dx- MDD in remission   Plan: Continue Zoloft 50 mgr QDAY for  depression  Will see in 6- 8 weeks, agrees to contact clinic sooner if any worsening prior or if any medication concerns .   She has clinic phone number as well as crisis hotline number to contact if needed     April 2023, MD 07/08/2022, 10:53 AM  Patient ID: Craige Cotta, female   DOB: 04-Nov-1995, 26 y.o.   MRN: [id number]/06/[id number]

## 2022-07-08 NOTE — Progress Notes (Signed)
BH MD/PA/NP OP Progress Note 05/14/2022:  Stacy Randall  MRN:  [id number] This was a phone based appointment.  Patient's identity verified using 2 different identifiers.  Limitations regarding this type of communication have been reviewed. Location of parties Patient-Home [facility]  Duration- 20 minutes    Chief Complaint:  medication management appointment   HPI: 26 yo female, history of depression, anxiety, dating back to adolescence . She was being followed by Dr. Hinton Dyer, who has retired . I am providing outpatient psychiatric coverage pending new clinician starting .    Ms Taha reports she has been doing well and describes her mood as improved.  She reports her mood has been generally stable and denies anhedonia or other neurovegetative symptoms.  Presents euthymic with a full range of affect at this time.  Anxiety symptoms have also improved.    She is currently functioning well in her daily activities which include working full-time-currently she reports she is working for [employer] as she waits [place] get [place] . ( Reports was accepted but has not formally been given a starting date or schedule yet ) .   She reports stable relationship with her parents and sibling,  with whom she lives.  Denies medication side effects, she is currently taking Zoloft at 50 mg daily.                           Visit Diagnosis:  No diagnosis found.  Past Psychiatric History: Please see intake H&P.  Past Medical History:  Past Medical History:  Diagnosis Date   Depression    No past surgical history on file.  Family Psychiatric History: None  Family History: No family history on file.  Social History:  Social History   Socioeconomic History   Marital status: Single    Spouse name: Not on file   Number of children: 0   Years of education: Not on file   Highest education level: Some college, no degree  Occupational  History   Occupation: CNA  Tobacco Use   Smoking status: Never   Smokeless tobacco: Never  Vaping Use   Vaping Use: Never used  Substance and Sexual Activity   Alcohol use: No   Drug use: Never   Sexual activity: Never    Birth control/protection: Abstinence  Other Topics Concern   Not on file  Social History Narrative   CNA through [employer]   Social Determinants of Health   Financial Resource Strain: Not on file  Food Insecurity: Not on file  Transportation Needs: Not on file  Physical Activity: Not on file  Stress: Not on file  Social Connections: Not on file    Allergies: No Known Allergies  Metabolic Disorder Labs: No results found for: "HGBA1C", "MPG" No results found for: "PROLACTIN" No results found for: "CHOL", "TRIG", "HDL", "CHOLHDL", "VLDL", "LDLCALC" No results found for: "TSH"  Therapeutic Level Labs: No results found for: "LITHIUM" No results found for: "VALPROATE" No results found for: "CBMZ"  Current Medications: Current Outpatient Medications  Medication Sig Dispense Refill   neomycin-polymyxin-hydrocortisone (CORTISPORIN) 3.5-10000-1 OTIC suspension Place 3 drops into both ears 3 (three) times daily. 10 mL 0   sertraline (ZOLOFT) 50 MG tablet Take 1 tablet (50 mg total) by mouth daily. 30 tablet 1   No current facility-administered medications for this visit.     Psychiatric Specialty Exam: Please note limitations in obtaining full mental status  exam in the context of phone assessment ROS-none endorsed  There were no vitals taken for this visit.There is no height or weight on file to calculate BMI.  General Appearance: NA  Eye Contact: NA  Speech:  Normal Rate  Volume:  Normal  Mood: Describes stable/improved mood and presents euthymic  Affect: Appropriate.  Presents full in range  Thought Process:  Goal Directed/linear  Orientation:  Full (Time, Place, and Person)  Thought Content: no hallucinations, no delusions expressed .     Suicidal Thoughts:  No no SI and presents future oriented  Homicidal Thoughts:  No  Memory:  Recent and remote grossly intact   Judgement:  Good  Insight:  Fair  Psychomotor Activity:  NA  Concentration:  Grossly intact   Recall:  Good  Fund of Knowledge: Good  Language: Good  Akathisia:  Negative  Handed:  Right  AIMS (if indicated): not done  Assets:  Communication Skills Desire for Improvement Financial Resources/Insurance Housing Physical Health Social Support Talents/Skills  ADL's:  Intact  Cognition: WNL  Sleep:  Fair   Screenings: PHQ2-9    Flowsheet Row [facility] from 06/25/2020 [facility] from 06/17/2020 in [facility]  PHQ-2 Total Score 3 3 3   PHQ-9 Total Score 10 7 13       Flowsheet Row ED from 12/29/2021 in [facility] ED from 09/07/2021 in [facility] [place] [facility] Video Visit from 11/04/2020 in [facility]  C-SSRS RISK CATEGORY No Risk No Risk No Risk        Assessment and Plan:   26 year-old female. History of depression, anxiety.    She reports she is doing well.  Describes improved mood and presents euthymic.  Does not endorse anhedonia or other neurovegetative symptoms.  Denies significant anxiety.  Focused on employment issues and currently working at 11/06/2020 but waiting to start full-time work at 23.  Tolerating Zoloft well which she states has been effective and well-tolerated.  Currently at 50 mg daily.      Dx- MDD in remission   Plan: Continue Zoloft 50 mgr QDAY for depression  Will see in 6- 8 weeks, agrees to contact clinic sooner if any worsening prior or if any medication concerns .   She has clinic phone number as well as crisis hotline number to contact if needed      [employer], MD 07/08/2022, 10:39 AM  Patient ID: Craige Cotta, female   DOB: [birth year], 26 y.o.   MRN: 05/06/1997 Patient ID: Jenet Durio, female   DOB: 25-Mar-1996, 26 y.o.   MRN: 05/06/1997

## 2022-08-13 ENCOUNTER — Ambulatory Visit
Admission: EM | Admit: 2022-08-13 | Discharge: 2022-08-13 | Disposition: A | Discharge status: Home / Self Care | Payer: BC Managed Care – PPO | Attending: Family Medicine | Admitting: Family Medicine

## 2022-08-13 DIAGNOSIS — R21 Rash and other nonspecific skin eruption: Secondary | ICD-10-CM

## 2022-08-13 MED ORDER — PREDNISONE 10 MG (21) PO TBPK: ORAL_TABLET | Freq: Every day | ORAL | 0 refills | Status: DC

## 2022-08-13 MED ORDER — DOXYCYCLINE HYCLATE 100 MG PO CAPS: 100.0000 mg | ORAL_CAPSULE | Freq: Two times a day (BID) | ORAL | 0 refills | Status: AC

## 2022-08-13 NOTE — Discharge Instructions (Addendum)
Instructed patient to take medication as directed with food to completion.  Advised patient to take prednisone with first dose of doxycycline for the next 10 days.  Encouraged patient to increase daily water intake to 64 ounces per day while taking these medications.  Advised if symptoms worsen and/or unresolved please follow-up with PCP or here for further evaluation.

## 2022-08-13 NOTE — ED Triage Notes (Signed)
Patient presents to [facility] for left lower leg rash. States rash first appeared a month ago, PCP prescribed Kenalog that helped until it reappeared since Monday. States irritation to site and has been scratching it.   Denies pain or fever.

## 2022-08-13 NOTE — ED Provider Notes (Signed)
[facility]    CSN: [id number] Arrival date & time: 08/13/22  1820      History   Chief Complaint Chief Complaint  Patient presents with   Rash    HPI Stacy Randall is a 27 y.o. female.   HPI 27 year old female presents with left lower leg rash which first appeared a month ago.  Patient reports PCP prescribed Kenalog that helped until it reappeared on Monday.  Past Medical History:  Diagnosis Date   Depression     Patient Active Problem List   Diagnosis Date Noted   Moderate episode of recurrent major depressive disorder ([place]) 11/04/2020   Premenstrual dysphoric disorder 02/07/2020   Acne 04/27/2013    History reviewed. No pertinent surgical history.  OB History   No obstetric history on file.      Home Medications    Prior to Admission medications   Medication Sig Start Date End Date Taking? Authorizing Provider  doxycycline (VIBRAMYCIN) 100 MG capsule Take 1 capsule (100 mg total) by mouth 2 (two) times daily for 10 days. 08/13/22 08/23/22 Yes Eliezer Lofts, FNP  doxycycline (VIBRAMYCIN) 100 MG capsule Take 1 capsule (100 mg total) by mouth 2 (two) times daily for 10 days. 08/13/22 08/23/22 Yes Eliezer Lofts, FNP  predniSONE (STERAPRED UNI-PAK 21 TAB) 10 MG (21) TBPK tablet Take by mouth daily. Take 6 tabs by mouth daily  for 2 days, then 5 tabs for 2 days, then 4 tabs for 2 days, then 3 tabs for 2 days, 2 tabs for 2 days, then 1 tab by mouth daily for 2 days 08/13/22  Yes Eliezer Lofts, FNP  predniSONE (STERAPRED UNI-PAK 21 TAB) 10 MG (21) TBPK tablet Take by mouth daily. Take 6 tabs by mouth daily  for 2 days, then 5 tabs for 2 days, then 4 tabs for 2 days, then 3 tabs for 2 days, 2 tabs for 2 days, then 1 tab by mouth daily for 2 days 08/13/22  Yes Eliezer Lofts, FNP  neomycin-polymyxin-hydrocortisone (CORTISPORIN) 3.5-10000-1 OTIC suspension Place 3 drops into both ears 3 (three) times daily. 12/29/21   Raylene Everts, MD  sertraline (ZOLOFT) 50 MG  tablet Take 1 tablet (50 mg total) by mouth daily. 07/08/22 09/06/22  Cobos, Myer Peer, MD    Family History History reviewed. No pertinent family history.  Social History Social History   Tobacco Use   Smoking status: Never   Smokeless tobacco: Never  Vaping Use   Vaping Use: Never used  Substance Use Topics   Alcohol use: No   Drug use: Never     Allergies   Patient has no known allergies.   Review of Systems Review of Systems  Skin:  Positive for rash.  All other systems reviewed and are negative.    Physical Exam Triage Vital Signs ED Triage Vitals  Enc Vitals Group     BP 08/13/22 1911 113/71     Pulse Rate 08/13/22 1911 74     Resp 08/13/22 1911 16     Temp 08/13/22 1911 98.3 F (36.8 C)     Temp Source 08/13/22 1911 Oral     SpO2 08/13/22 1911 97 %     Weight --      Height --      Head Circumference --      Peak Flow --      Pain Score 08/13/22 1910 0     Pain Loc --      Pain Edu? --  Excl. in GC? --    No data found.  Updated Vital Signs BP 113/71 (BP Location: Left Arm)   Pulse 74   Temp 98.3 F (36.8 C) (Oral)   Resp 16   LMP 07/30/2022   SpO2 97%   Visual Acuity Right Eye Distance:   Left Eye Distance:   Bilateral Distance:    Right Eye Near:   Left Eye Near:    Bilateral Near:     Physical Exam Vitals and nursing note reviewed.  Constitutional:      Appearance: Normal appearance. She is normal weight.  HENT:     Head: Normocephalic and atraumatic.     Mouth/Throat:     Mouth: Mucous membranes are moist.     Pharynx: Oropharynx is clear.  Eyes:     Extraocular Movements: Extraocular movements intact.     Conjunctiva/sclera: Conjunctivae normal.     Pupils: Pupils are equal, round, and reactive to light.  Cardiovascular:     Rate and Rhythm: Normal rate and regular rhythm.     Pulses: Normal pulses.     Heart sounds: Normal heart sounds. No murmur heard. Pulmonary:     Effort: Pulmonary effort is normal.      Breath sounds: Normal breath sounds. No wheezing or rhonchi.  Musculoskeletal:        General: Normal range of motion.     Cervical back: Normal range of motion and neck supple.  Skin:    General: Skin is warm and dry.     Comments: Left lower leg (medial aspect): Mildly erythematous pruritic rash/maculopapular eruption noted-please see image below  Neurological:     General: No focal deficit present.     Mental Status: She is alert and oriented to person, place, and time.      UC Treatments / Results  Labs (all labs ordered are listed, but only abnormal results are displayed) Labs Reviewed - No data to display  EKG   Radiology No results found.  Procedures Procedures (including critical care time)  Medications Ordered in UC Medications - No data to display  Initial Impression / Assessment and Plan / UC Course  I have reviewed the triage vital signs and the nursing notes.  Pertinent labs & imaging results that were available during my care of the patient were reviewed by me and considered in my medical decision making (see chart for details).     MDM: 1.  Rash and nonspecific skin eruption-Rx'd Sterapred Unipak, doxycycline. Instructed patient to take medication as directed with food to completion.  Advised patient to take prednisone with first dose of doxycycline for the next 10 days.  Encouraged patient to increase daily water intake to 64 ounces per day while taking these medications.  Advised if symptoms worsen and/or unresolved please follow-up with PCP or here for further evaluation.  Patient discharged home, hemodynamically stable. Final Clinical Impressions(s) / UC Diagnoses   Final diagnoses:  Rash and nonspecific skin eruption     Discharge Instructions      Instructed patient to take medication as directed with food to completion.  Advised patient to take prednisone with first dose of doxycycline for the next 10 days.  Encouraged patient to increase daily  water intake to 64 ounces per day while taking these medications.  Advised if symptoms worsen and/or unresolved please follow-up with PCP or here for further evaluation.     ED Prescriptions     Medication Sig Dispense Auth. Provider   doxycycline (VIBRAMYCIN)  100 MG capsule Take 1 capsule (100 mg total) by mouth 2 (two) times daily for 10 days. 20 capsule Eliezer Lofts, FNP   predniSONE (STERAPRED UNI-PAK 21 TAB) 10 MG (21) TBPK tablet Take by mouth daily. Take 6 tabs by mouth daily  for 2 days, then 5 tabs for 2 days, then 4 tabs for 2 days, then 3 tabs for 2 days, 2 tabs for 2 days, then 1 tab by mouth daily for 2 days 42 tablet Eliezer Lofts, FNP   doxycycline (VIBRAMYCIN) 100 MG capsule Take 1 capsule (100 mg total) by mouth 2 (two) times daily for 10 days. 20 capsule Eliezer Lofts, FNP   predniSONE (STERAPRED UNI-PAK 21 TAB) 10 MG (21) TBPK tablet Take by mouth daily. Take 6 tabs by mouth daily  for 2 days, then 5 tabs for 2 days, then 4 tabs for 2 days, then 3 tabs for 2 days, 2 tabs for 2 days, then 1 tab by mouth daily for 2 days 42 tablet Eliezer Lofts, FNP      PDMP not reviewed this encounter.   Eliezer Lofts, [place] 08/13/22 2015

## 2022-08-31 ENCOUNTER — Telehealth (HOSPITAL_BASED_OUTPATIENT_CLINIC_OR_DEPARTMENT_OTHER): Payer: BC Managed Care – PPO | Admitting: Psychiatry

## 2022-08-31 ENCOUNTER — Encounter (HOSPITAL_COMMUNITY): Payer: Self-pay | Admitting: Psychiatry

## 2022-08-31 DIAGNOSIS — F3341 Major depressive disorder, recurrent, in partial remission: Secondary | ICD-10-CM | POA: Diagnosis not present

## 2022-08-31 DIAGNOSIS — F325 Major depressive disorder, single episode, in full remission: Secondary | ICD-10-CM

## 2022-08-31 MED ORDER — SERTRALINE HCL 50 MG PO TABS: 50.0000 mg | ORAL_TABLET | Freq: Every day | ORAL | 2 refills | Status: DC
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
## 2022-10-13 ENCOUNTER — Encounter (HOSPITAL_COMMUNITY): Payer: Self-pay | Admitting: Psychiatry

## 2022-10-13 ENCOUNTER — Telehealth (HOSPITAL_BASED_OUTPATIENT_CLINIC_OR_DEPARTMENT_OTHER): Payer: BC Managed Care – PPO | Admitting: Psychiatry

## 2022-10-13 DIAGNOSIS — F325 Major depressive disorder, single episode, in full remission: Secondary | ICD-10-CM

## 2022-10-13 DIAGNOSIS — F3341 Major depressive disorder, recurrent, in partial remission: Secondary | ICD-10-CM

## 2022-10-13 MED ORDER — SERTRALINE HCL 50 MG PO TABS: 50.0000 mg | ORAL_TABLET | Freq: Every day | ORAL | 2 refills | Status: DC

## 2022-10-13 NOTE — Progress Notes (Signed)
[facility] MD/PA/NP OP Progress Note 05/14/2022:  Stacy Randall  MRN:  [id number] This was a phone based appointment.  Patient's identity verified using 2 different identifiers.  Limitations regarding this type of communication have been reviewed. Location of parties [facility]  Duration- 20 minutes    Chief Complaint:  medication management appointment   HPI: 27 yo female, history of depression, anxiety, dating back to adolescence . She was being followed by Dr. Montel Culver, who has retired . I am providing outpatient psychiatric coverage pending new clinician starting .   Ms Bibb reports she is doing well, functioning well in daily activities . She describes stable, euthymic mood. Denies neuro-vegetative symptoms . Future oriented . Does endorse some premenstrual symptoms , such as feeling slightly sad or irritable on days preceding menses, but characterizes as mild and improved on current medication management . Remains focused on work. 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 She has wanted to transition to working as a [place] agent [place] and had applied and been accepted, but describes some frustration that she has been told there are no positions available for now .  Lives with her parents, describes stable /supportive family .  Denies medication side effects- on Zoloft , which she reports she has been taking regularly . ( She had reported having a rash on prior appointment , but states it has resolved /not recurred )  We reviewed termination issues - Probation officer is leaving clinic in March and she will be followed by another [facility] clinician. She expresses awareness/understanding .    Visit Diagnosis:  No diagnosis found.  Past Psychiatric History: Please see intake H&P.  Past Medical History:  Past Medical History:  Diagnosis Date   Depression    No past surgical history on file.  Family  Psychiatric History: None  Family History: No family history on file.  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[place] Determinants of Health   Financial Resource Strain: Not on file  Food Insecurity: Not on file  Transportation Needs: Not on file  Physical Activity: Not on file  Stress: Not on file  Social Connections: Not on file    Allergies: No Known Allergies  Metabolic Disorder Labs: No results found for: "HGBA1C", "MPG" No results found for: "PROLACTIN" No results found for: "CHOL", "TRIG", "HDL", "CHOLHDL", "VLDL", "LDLCALC" No results found for: "TSH"  Therapeutic Level Labs: No results found for: "LITHIUM" No results found for: "VALPROATE" No results found for: "CBMZ"  Current Medications: Current Outpatient Medications  Medication Sig Dispense Refill   neomycin-polymyxin-hydrocortisone (CORTISPORIN) 3.5-10000-1 OTIC suspension Place 3 drops into both ears 3 (three) times daily. 10 mL 0   predniSONE (STERAPRED UNI-PAK 21 TAB) 10 MG (21) TBPK tablet Take by mouth daily. Take 6  tabs by mouth daily  for 2 days, then 5 tabs for 2 days, then 4 tabs for 2 days, then 3 tabs for 2 days, 2 tabs for 2 days, then 1 tab by mouth daily for 2 days 42 tablet 0   predniSONE (STERAPRED UNI-PAK 21 TAB) 10 MG (21) TBPK tablet Take by mouth daily. Take 6 tabs by mouth daily  for 2 days, then 5 tabs for 2 days, then 4 tabs for 2 days,  then 3 tabs for 2 days, 2 tabs for 2 days, then 1 tab by mouth daily for 2 days 42 tablet 0   sertraline (ZOLOFT) 50 MG tablet Take 1 tablet (50 mg total) by mouth daily. 30 tablet 2   No current facility-administered medications for this visit.     Psychiatric Specialty Exam: Please note limitations in obtaining full mental status exam in the context of phone assessment ROS-reports lower extremity rash, now resolved  There were no vitals taken for this visit.There is no height or weight on file to calculate BMI.  General Appearance: NA  Eye Contact: NA  Speech:  Normal Rate  Volume:  Normal  Mood: stable, euthymic   Affect: Appropriate/ full in range  Thought Process:  Goal Directed/linear  Orientation:  Full (Time, Place, and Person)  Thought Content: no hallucinations, no delusions expressed .    Suicidal Thoughts:  No no SI - future oriented  Homicidal Thoughts:  No  Memory:  Recent and remote grossly intact   Judgement:  Good  Insight:  Fair  Psychomotor Activity:  NA  Concentration:  Grossly intact   Recall:  Good  Fund of Knowledge: Good  Language: Good  Akathisia:  Negative  Handed:  Right  AIMS (if indicated): not done  Assets:  Communication Skills Desire for Improvement Financial Resources/Insurance [place] Talents/Skills  ADL's:  Intact  Cognition: WNL  Sleep:  Fair   Screenings: PHQ2-9    Flowsheet Row Video Visit from 11/04/2020 in [facility] from 06/25/2020 in [facility] from 06/17/2020 in [facility]  PHQ-2 Total Score '3 3 3  '$ PHQ-9 Total Score '10 7 13      '$ Flowsheet Row ED from 08/13/2022 in [facility] ED from 12[facility]29/2021 in [facility] ED from 09/07/2021 in [facility] at [place] No Risk No Risk No Risk        Assessment and Plan:   27 year-old female. History of depression, anxiety.    Ms Balough reports she is doing well, functioning well in daily activities . She describes stable, euthymic mood. Denies neuro-vegetative symptoms . Future oriented . Does endorse some premenstrual symptoms , such as feeling slightly sad or irritable on days preceding menses, but characterizes as mild and improved on current medication management . Remains focused on work. 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 She has wanted to transition to working as a [place] agent [place] and had applied and been accepted, but describes some frustration that she has been told there are no positions available for now .   On Zoloft ( 50 mgr QDAY) . Tolerating well . Denies side effects.  We reviewed termination issues - Probation officer is leaving clinic in March and she will be followed by another [facility] clinician.  She expresses awareness/understanding .       Dx- MDD,  in full remission   Plan: Continue Zoloft 50 mgr QDAY for depression  Will see in about  8 weeks, agrees to contact clinic sooner if any worsening prior or if any medication concerns .   She has clinic phone number as well as crisis hotline number to contact if needed     Jenne Campus, MD 10/13/2022, 8:43 AM  Patient ID: Stacy Randall, female   DOB: 28-Aug-1995, 27 y.o.   MRN: [id number]

## 2022-12-09 ENCOUNTER — Ambulatory Visit (HOSPITAL_COMMUNITY): Payer: BC Managed Care – PPO | Admitting: Student in an Organized Health Care Education/Training Program

## 2024-07-25 ENCOUNTER — Ambulatory Visit: Payer: PRIVATE HEALTH INSURANCE

## 2024-07-25 ENCOUNTER — Other Ambulatory Visit: Payer: Self-pay

## 2024-07-25 ENCOUNTER — Ambulatory Visit
Admission: EM | Admit: 2024-07-25 | Discharge: 2024-07-25 | Disposition: A | Discharge status: Home / Self Care | Payer: PRIVATE HEALTH INSURANCE | Attending: Family Medicine | Admitting: Family Medicine

## 2024-07-25 DIAGNOSIS — M25562 Pain in left knee: Secondary | ICD-10-CM | POA: Diagnosis not present

## 2024-07-25 DIAGNOSIS — S86912A Strain of unspecified muscle(s) and tendon(s) at lower leg level, left leg, initial encounter: Secondary | ICD-10-CM

## 2024-07-25 MED ORDER — CELECOXIB 200 MG PO CAPS: 200.0000 mg | ORAL_CAPSULE | Freq: Every day | ORAL | 0 refills | Status: AC

## 2024-07-25 NOTE — ED Provider Notes (Signed)
 [name]    CSN: [id number] Arrival date & time: 07/25/24  1238      History   Chief Complaint Chief Complaint  Patient presents with   Knee Pain    HPI Stacy Randall is a 28 y.o. female.   HPI 28 year old female presents with left knee pain for 1 week.  Patient reports painful when bearing weight.  PMH significant for depression.  Past Medical History:  Diagnosis Date   Depression     Patient Active Problem List   Diagnosis Date Noted   Moderate episode of recurrent major depressive disorder (HCC) 11/04/2020   Premenstrual dysphoric disorder 02/07/2020   Acne 04/27/2013    History reviewed. No pertinent surgical history.  OB History   No obstetric history on file.      Home Medications    Prior to Admission medications  Medication Sig Start Date End Date Taking? Authorizing Provider  celecoxib  (CELEBREX ) 200 MG capsule Take 1 capsule (200 mg total) by mouth daily for 15 days. 07/25/24 08/09/24 Yes Teddy Sharper, FNP    Family History History reviewed. No pertinent family history.  Social History Social History[1]   Allergies   Patient has no known allergies.   Review of Systems Review of Systems  Musculoskeletal:        Left knee pain x 1 week     Physical Exam Triage Vital Signs ED Triage Vitals  Encounter Vitals Group     BP 07/25/24 1252 118/70     Girls Systolic BP Percentile --      Girls Diastolic BP Percentile --      Boys Systolic BP Percentile --      Boys Diastolic BP Percentile --      Pulse Rate 07/25/24 1252 76     Resp 07/25/24 1252 20     Temp 07/25/24 1252 98.9 F (37.2 C)     Temp Source 07/25/24 1252 Oral     SpO2 07/25/24 1252 97 %     Weight 07/25/24 1255 112 lb (50.8 kg)     Height 07/25/24 1255 5' 9 (1.753 m)     Head Circumference --      Peak Flow --      Pain Score 07/25/24 1255 6     Pain Loc --      Pain Education --      Exclude from Growth Chart --    No data found.  Updated  Vital Signs BP 118/70 (BP Location: Right Arm)   Pulse 76   Temp 98.9 F (37.2 C) (Oral)   Resp 20   Ht 5' 9 (1.753 m)   Wt 112 lb (50.8 kg)   LMP 05/24/2024   SpO2 97%   BMI 16.54 kg/m    Physical Exam Vitals and nursing note reviewed.  Constitutional:      Appearance: Normal appearance. She is normal weight.  HENT:     Head: Normocephalic and atraumatic.     Mouth/Throat:     Mouth: Mucous membranes are moist.     Pharynx: Oropharynx is clear.  Eyes:     Extraocular Movements: Extraocular movements intact.     Conjunctiva/sclera: Conjunctivae normal.     Pupils: Pupils are equal, round, and reactive to light.  Cardiovascular:     Rate and Rhythm: Normal rate and regular rhythm.     Heart sounds: Normal heart sounds.  Pulmonary:     Effort: Pulmonary effort is normal.     Breath sounds:  Normal breath sounds. No wheezing, rhonchi or rales.  Musculoskeletal:        General: Normal range of motion.     Comments: Left knee (anterior aspect): TTP over inferior aspect of patella no deformity noted, full range of motion with flexion/extension  Skin:    General: Skin is warm and dry.  Neurological:     General: No focal deficit present.     Mental Status: She is alert and oriented to person, place, and time. Mental status is at baseline.  Psychiatric:        Mood and Affect: Mood normal.        Behavior: Behavior normal.      UC Treatments / Results  Labs (all labs ordered are listed, but only abnormal results are displayed) Labs Reviewed - No data to display  EKG   Radiology DG Knee Complete 4 Views Left Result Date: 07/25/2024 EXAM: 4 VIEW(S) XRAY OF THE LEFT KNEE 07/25/2024 01:30:00 PM COMPARISON: None available. CLINICAL HISTORY: Left knee pain x 1 week worse when walking FINDINGS: BONES AND JOINTS: No acute fracture. No malalignment. No significant joint effusion. SOFT TISSUES: The soft tissues are unremarkable. IMPRESSION: 1. No significant abnormality.  Electronically signed by: Elsie Gravely MD 07/25/2024 01:36 PM EST RP Workstation: [id number]    Procedures Procedures (including critical care time)  Medications Ordered in UC Medications - No data to display  Initial Impression / Assessment and Plan / UC Course  I have reviewed the triage vital signs and the nursing notes.  Pertinent labs & imaging results that were available during my care of the patient were reviewed by me and considered in my medical decision making (see chart for details).     MDM: 1.  Acute pain of left knee-left knee x-ray results revealed above, Rx'd Celebrex  200 mg capsule: Take 1 capsule daily x 15 days; 2.  Knee strain, left, initial encounter-Ace wrap placed on left knee prior to discharge.  Advised patient of left knee x-ray results with hardcopy and image provided.  Advised take medication as directed with food to completion.  Additionally, advised patient may RICE affected area of left knee for 30 minutes 3 times daily for the next 3 days advised if symptoms worsen and/or unresolved please follow-up with your PCP or [facility] orthopedics for further evaluation.  Contact information provided with his AVS today.  Patient discharged home, hemodynamically stable.  Final Clinical Impressions(s) / UC Diagnoses   Final diagnoses:  Acute pain of left knee  Knee strain, left, initial encounter     Discharge Instructions      Advised patient of left knee x-ray results with hardcopy and image provided.  Advised take medication as directed with food to completion.  Additionally, advised patient may RICE affected area of left knee for 30 minutes 3 times daily for the next 3 days advised if symptoms worsen and/or unresolved please follow-up with your PCP or [facility] orthopedics for further evaluation.  Contact information provided with his AVS today.     ED Prescriptions     Medication Sig Dispense Auth. Provider   celecoxib  (CELEBREX ) 200 MG capsule Take  1 capsule (200 mg total) by mouth daily for 15 days. 15 capsule Brieanna Nau, FNP      PDMP not reviewed this encounter.    [1]  Social History Tobacco Use   Smoking status: Never   Smokeless tobacco: Never  Vaping Use   Vaping status: Never Used  Substance Use Topics  Alcohol use: No   Drug use: Never     Teddy Sharper, FNP 07/25/24 1431

## 2024-07-25 NOTE — ED Triage Notes (Signed)
 Pt presenting with c/o left knee pain x 1 week. Pt unsure of what may have initiated the pain. Pt stated that she has been taking Ibuprofen, last taken this AM which has been ineffective.

## 2024-07-25 NOTE — Discharge Instructions (Addendum)
 Advised patient of left knee x-ray results with hardcopy and image provided.  Advised take medication as directed with food to completion.  Additionally, advised patient may RICE affected area of left knee for 30 minutes 3 times daily for the next 3 days advised if symptoms worsen and/or unresolved please follow-up with your PCP or [facility] orthopedics for further evaluation.  Contact information provided with his AVS today.
